# Patient Record
Sex: Male | Born: 1950 | Race: White | Hispanic: No | Marital: Married | Smoking: Never smoker
Health system: Southern US, Community
[De-identification: ages and names within clinical notes are randomized; demographics above are authoritative.]

## PROBLEM LIST (undated history)

## (undated) DIAGNOSIS — Z8489 Family history of other specified conditions: Secondary | ICD-10-CM

## (undated) DIAGNOSIS — C801 Malignant (primary) neoplasm, unspecified: Secondary | ICD-10-CM

## (undated) DIAGNOSIS — J45909 Unspecified asthma, uncomplicated: Secondary | ICD-10-CM

## (undated) HISTORY — PX: OTHER SURGICAL HISTORY: SHX169

## (undated) HISTORY — PX: TONSILECTOMY, ADENOIDECTOMY, BILATERAL MYRINGOTOMY AND TUBES: SHX2538

---

## 2004-10-16 ENCOUNTER — Ambulatory Visit: Payer: Self-pay | Admitting: Specialist

## 2011-01-16 ENCOUNTER — Emergency Department: Payer: Self-pay | Admitting: Emergency Medicine

## 2017-10-08 ENCOUNTER — Encounter: Payer: Self-pay | Admitting: Urology

## 2017-10-08 ENCOUNTER — Ambulatory Visit: Payer: BLUE CROSS/BLUE SHIELD | Admitting: Urology

## 2017-10-08 VITALS — BP 147/67 | HR 66 | Ht 72.0 in | Wt 200.8 lb

## 2017-10-08 DIAGNOSIS — F518 Other sleep disorders not due to a substance or known physiological condition: Secondary | ICD-10-CM

## 2017-10-08 NOTE — Progress Notes (Signed)
10/08/2017 1:59 PM   Roy Gentry Leslee Home 1951-03-09 [id number]  Referring provider: Lavera Guise, PA-C [address] [place], [place] [address]  Chief Complaint  Patient presents with  . Prostate Cancer    HPI: The patient is a 67 year old gentleman who presents today for evaluation after he was noted that his prostate was enlarged and hard on a recent colonoscopy.  His PSA in March 2019 was 1.85.  The patient reports that he has never heard that his prostate felt a regular wire or that his PSA has been elevated.  He has a cousin who had prostate cancer but no other relatives with the disease.  He has no urinary complaints at this time.  He denies hematuria, UTI, and nephrolithiasis.  He does have erectile dysfunction which is well controlled on sildenafil.   PMH: History reviewed. No pertinent past medical history.  Surgical History: Past Surgical History:  Procedure Laterality Date  . TONSILECTOMY, ADENOIDECTOMY, BILATERAL MYRINGOTOMY AND TUBES      Home Medications:  Allergies as of 10/08/2017      Reactions   Simvastatin    Other reaction(s): Muscle Pain Joint pain      Medication List        Accurate as of 10/08/17  1:59 PM. Always use your most recent med list.          Melatonin 5 MG Chew Chew by mouth.   sildenafil 100 MG tablet Commonly known as:  VIAGRA TAKE 1 TABLET (100 MG TOTAL) BY MOUTH ONCE DAILY AS NEEDED FOR ERECTILE DYSFUNCTION       Allergies:  Allergies  Allergen Reactions  . Simvastatin     Other reaction(s): Muscle Pain Joint pain    Family History: Family History  Problem Relation Age of Onset  . Prostate cancer Neg Hx   . Bladder Cancer Neg Hx   . Kidney cancer Neg Hx     Social History:  has no tobacco, alcohol, and drug history on file.  ROS: UROLOGY Frequent Urination?: No Hard to postpone urination?: No Burning/pain with urination?: No Get up at night to urinate?: No Leakage of urine?:  No Urine stream starts and stops?: No Trouble starting stream?: No Do you have to strain to urinate?: No Blood in urine?: No Urinary tract infection?: No Sexually transmitted disease?: No Injury to kidneys or bladder?: No Painful intercourse?: No Weak stream?: No Erection problems?: Yes Penile pain?: No  Gastrointestinal Nausea?: No Vomiting?: No Indigestion/heartburn?: No Diarrhea?: Yes Constipation?: No  Constitutional Fever: No Night sweats?: No Weight loss?: No Fatigue?: No  Skin Skin rash/lesions?: No Itching?: No  Eyes Blurred vision?: No Double vision?: No  Ears/Nose/Throat Sore throat?: No Sinus problems?: Yes  Hematologic/Lymphatic Swollen glands?: No Easy bruising?: No  Cardiovascular Leg swelling?: No Chest pain?: No  Respiratory Cough?: No Shortness of breath?: No  Endocrine Excessive thirst?: No  Musculoskeletal Back pain?: No Joint pain?: No  Neurological Headaches?: Yes Dizziness?: No  Psychologic Depression?: No Anxiety?: No  Physical Exam: BP (!) 147/67 (BP Location: Right Arm, Patient Position: Sitting, Cuff Size: Normal)   Pulse 66   Ht 6' (1.829 m)   Wt 200 lb 12.8 oz (91.1 kg)   BMI 27.23 kg/m   Constitutional:  Alert and oriented, No acute distress. HEENT: [place] AT, moist mucus membranes.  Trachea midline, no masses. Cardiovascular: No clubbing, cyanosis, or edema. Respiratory: Normal respiratory effort, no increased work of breathing. GI: Abdomen is soft, nontender, nondistended, no abdominal  masses GU: No CVA tenderness.  Normal phallus.  Testicles descended equally bilaterally.  Benign.  DRE 2+, left apex and mid of the prostate is indurated compared to the rest of the exam.  Normal right lobe.  No discrete nodules. Skin: No rashes, bruises or suspicious lesions. Lymph: No cervical or inguinal adenopathy. Neurologic: Grossly intact, no focal deficits, moving all 4 extremities. Psychiatric: Normal mood and  affect.  Laboratory Data: No results found for: WBC, HGB, HCT, MCV, PLT  No results found for: CREATININE  No results found for: PSA  No results found for: TESTOSTERONE  No results found for: HGBA1C  Urinalysis No results found for: COLORURINE, APPEARANCEUR, LABSPEC, PHURINE, GLUCOSEU, HGBUR, BILIRUBINUR, KETONESUR, PROTEINUR, UROBILINOGEN, NITRITE, LEUKOCYTESUR  Assessment & Plan:    1.  Induration of the left lobe of the prostate on digital rectal exam I discussed with the patient the indications for prostate biopsy which include having a normal digital rectal exam.  We discussed the next step would be a prostate biopsy.  We discussed this procedure in detail.  He understands the risks include bleeding and infection.  He knows to expect blood in his urine and stool for 48 hours and in his semen for up to 6 weeks.  He does understand the risk of sepsis requiring hospitalization is approximately 1 to 2%.  All questions were answered.  He is agreeable to proceeding.  Return for prostate biopsy.  Nickie Retort, MD  [facility] [address] [place], [place] [address] [phone]

## 2017-10-28 ENCOUNTER — Other Ambulatory Visit: Payer: Self-pay | Admitting: Urology

## 2017-10-28 ENCOUNTER — Ambulatory Visit (INDEPENDENT_AMBULATORY_CARE_PROVIDER_SITE_OTHER): Payer: Medicare Other | Admitting: Urology

## 2017-10-28 ENCOUNTER — Encounter: Payer: Self-pay | Admitting: Urology

## 2017-10-28 VITALS — BP 141/73 | HR 66 | Resp 16 | Ht 72.0 in | Wt 199.8 lb

## 2017-10-28 DIAGNOSIS — N400 Enlarged prostate without lower urinary tract symptoms: Secondary | ICD-10-CM | POA: Diagnosis not present

## 2017-10-28 MED ORDER — GENTAMICIN SULFATE 40 MG/ML IJ SOLN
80.0000 mg | Freq: Once | INTRAMUSCULAR | Status: AC
  Administered 2017-10-28: 80 mg via INTRAMUSCULAR

## 2017-10-28 MED ORDER — LEVOFLOXACIN 500 MG PO TABS
500.0000 mg | ORAL_TABLET | Freq: Once | ORAL | Status: AC
Start: 2017-10-28 — End: 2017-10-28
  Administered 2017-10-28: 500 mg via ORAL

## 2017-10-28 NOTE — Progress Notes (Signed)
Prostate Biopsy Procedure  67 year old male with a PSA of 1.85 but abnormal DRE.  On my exam there is induration of the right apex and not the left as described in Dr. Carlynn Purl note.  Informed consent was obtained after discussing risks/benefits of the procedure.  A time out was performed to ensure correct patient identity.  Pre-Procedure: - Last PSA Level: 07/2017 1.85 - Gentamicin given prophylactically - Levaquin 500 mg administered PO -Transrectal Ultrasound performed revealing a 43.5 gm prostate - Hypoechoic area right apical region  Procedure: - Prostate block performed using 10 cc 1% lidocaine and biopsies taken from sextant areas, a total of 12 under ultrasound guidance.  A digital biopsy of the right apex was included in the right apical cores  Post-Procedure: - Patient tolerated the procedure well - He was counseled to seek immediate medical attention if experiences any severe pain, significant bleeding, or fevers - Return in one week to discuss biopsy results   Roy Mellott C.  Roy Heater, MD

## 2017-10-29 ENCOUNTER — Other Ambulatory Visit: Payer: BLUE CROSS/BLUE SHIELD

## 2017-11-04 ENCOUNTER — Other Ambulatory Visit: Payer: Self-pay | Admitting: Urology

## 2017-11-04 LAB — PATHOLOGY REPORT

## 2017-11-11 ENCOUNTER — Encounter: Payer: Self-pay | Admitting: Urology

## 2017-11-11 ENCOUNTER — Ambulatory Visit (INDEPENDENT_AMBULATORY_CARE_PROVIDER_SITE_OTHER): Payer: Medicare Other | Admitting: Urology

## 2017-11-11 VITALS — BP 127/76 | HR 58 | Resp 16 | Ht 72.0 in | Wt 202.9 lb

## 2017-11-11 DIAGNOSIS — C61 Malignant neoplasm of prostate: Secondary | ICD-10-CM

## 2017-11-11 NOTE — Progress Notes (Signed)
11/11/2017 2:41 PM   Roy Gentry [birth year] [id number]  Referring provider: Baxter Hire, Gentry Roy Gentry, Roy Gentry [id number]  Chief Complaint  Patient presents with  . Follow-up    HPI: 67 year old male presents for prostate biopsy follow-up.  Prostate biopsy was performed on 10/28/2017 for a hard nodule involving the right apex and mid prostate.  Prostate volume was calculated at 43.5 g.  The right peripheral zone was hypoechoic in the mid to apical prostate.  Standard 12 core biopsies were performed.  Digital biopsy was also obtained and placed in the right apical jars.    Pathology: All left-sided cores were negative for malignancy. The right apical core was positive for Gleason 4+4 adenocarcinoma involving 84% of the submitted tissue in the right lateral apical core showed Gleason 4+5 adenocarcinoma involving 93% of the submitted tissue.  The right lateral mid core showed Gleason 4+4 adenocarcinoma involving 96% of the submitted tissue.  Low volume Gleason 3+4 adenocarcinoma was found in the base, lateral base and right mid cores.  PMH: History reviewed. No pertinent past medical history.  Surgical History: Past Surgical History:  Procedure Laterality Date  . TONSILECTOMY, ADENOIDECTOMY, BILATERAL MYRINGOTOMY AND TUBES      Home Medications:  Allergies as of 11/11/2017      Reactions   Simvastatin    Other reaction(s): Muscle Pain Joint pain      Medication List        Accurate as of 11/11/17  2:41 PM. Always use your most recent med list.          Melatonin 5 MG Chew Chew by mouth.   sildenafil 100 MG tablet Commonly known as:  VIAGRA TAKE 1 TABLET (100 MG TOTAL) BY MOUTH ONCE DAILY AS NEEDED FOR ERECTILE DYSFUNCTION       Allergies:  Allergies  Allergen Reactions  . Simvastatin     Other reaction(s): Muscle Pain Joint pain    Family History: Family History  Problem Relation Age of Onset  . Prostate cancer Neg Hx   . Bladder  Cancer Neg Hx   . Kidney cancer Neg Hx     Social History:  reports that he has never smoked. He has never used smokeless tobacco. His alcohol and drug histories are not on file.  ROS: UROLOGY Frequent Urination?: No Hard to postpone urination?: No Burning/pain with urination?: No Get up at night to urinate?: No Leakage of urine?: No Urine stream starts and stops?: No Trouble starting stream?: No Do you have to strain to urinate?: No Blood in urine?: No Urinary tract infection?: No Sexually transmitted disease?: No Injury to kidneys or bladder?: No Painful intercourse?: No Weak stream?: No Erection problems?: No Penile pain?: No  Gastrointestinal Nausea?: No Vomiting?: No Indigestion/heartburn?: No Diarrhea?: No Constipation?: No  Constitutional Fever: No Night sweats?: No Weight loss?: No Fatigue?: No  Skin Skin rash/lesions?: No Itching?: No  Eyes Blurred vision?: No Double vision?: No  Ears/Nose/Throat Sore throat?: No Sinus problems?: No  Hematologic/Lymphatic Swollen glands?: No Easy bruising?: No  Cardiovascular Leg swelling?: No Chest pain?: No  Respiratory Cough?: No Shortness of breath?: No  Endocrine Excessive thirst?: No  Musculoskeletal Back pain?: No Joint pain?: No  Neurological Headaches?: No Dizziness?: No  Psychologic Depression?: No Anxiety?: No  Physical Exam: BP 127/76   Pulse (!) 58   Resp 16   Ht 6' (1.829 m)   Wt 202 lb 14.4 oz (92 kg)   SpO2 96%  BMI 27.52 kg/m   Constitutional:  Alert and oriented, No acute distress.   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  The pathology report was discussed in detail with Roy Gentry and his wife.  Will schedule staging studies including a bone scan and CT of the abdomen pelvis.  They were informed treatment recommendations would be based on theresults of these studies.  They were provided literature on  diagnosis and treatment of prostate cancer.  He will follow-up after bone scan/CT.  Greater than 50% of this 15-minute visit was spent counseling the patient.   Roy Gentry, Roy Gentry [address], [place], [place] [address] [phone]

## 2017-11-12 ENCOUNTER — Ambulatory Visit: Payer: BLUE CROSS/BLUE SHIELD

## 2017-11-24 ENCOUNTER — Encounter
Admission: RE | Admit: 2017-11-24 | Discharge: 2017-11-24 | Disposition: A | Discharge status: Home / Self Care | Payer: Medicare Other | Source: Ambulatory Visit | Attending: Urology | Admitting: Urology

## 2017-11-24 ENCOUNTER — Ambulatory Visit
Admission: RE | Admit: 2017-11-24 | Discharge: 2017-11-24 | Disposition: A | Discharge status: Home / Self Care | Payer: Medicare Other | Source: Ambulatory Visit | Attending: Urology | Admitting: Urology

## 2017-11-24 ENCOUNTER — Other Ambulatory Visit: Payer: Self-pay

## 2017-11-24 DIAGNOSIS — D1723 Benign lipomatous neoplasm of skin and subcutaneous tissue of right leg: Secondary | ICD-10-CM | POA: Diagnosis not present

## 2017-11-24 DIAGNOSIS — I700 Atherosclerosis of aorta: Secondary | ICD-10-CM | POA: Diagnosis not present

## 2017-11-24 DIAGNOSIS — M19072 Primary osteoarthritis, left ankle and foot: Secondary | ICD-10-CM | POA: Insufficient documentation

## 2017-11-24 DIAGNOSIS — M19012 Primary osteoarthritis, left shoulder: Secondary | ICD-10-CM | POA: Insufficient documentation

## 2017-11-24 DIAGNOSIS — R937 Abnormal findings on diagnostic imaging of other parts of musculoskeletal system: Secondary | ICD-10-CM | POA: Insufficient documentation

## 2017-11-24 DIAGNOSIS — K5730 Diverticulosis of large intestine without perforation or abscess without bleeding: Secondary | ICD-10-CM | POA: Diagnosis not present

## 2017-11-24 DIAGNOSIS — C61 Malignant neoplasm of prostate: Secondary | ICD-10-CM | POA: Insufficient documentation

## 2017-11-24 DIAGNOSIS — I252 Old myocardial infarction: Secondary | ICD-10-CM | POA: Diagnosis not present

## 2017-11-24 DIAGNOSIS — M19011 Primary osteoarthritis, right shoulder: Secondary | ICD-10-CM | POA: Insufficient documentation

## 2017-11-24 DIAGNOSIS — M170 Bilateral primary osteoarthritis of knee: Secondary | ICD-10-CM | POA: Diagnosis not present

## 2017-11-24 DIAGNOSIS — M19071 Primary osteoarthritis, right ankle and foot: Secondary | ICD-10-CM | POA: Insufficient documentation

## 2017-11-24 DIAGNOSIS — N400 Enlarged prostate without lower urinary tract symptoms: Secondary | ICD-10-CM | POA: Insufficient documentation

## 2017-11-24 HISTORY — DX: Unspecified asthma, uncomplicated: J45.909

## 2017-11-24 HISTORY — DX: Malignant (primary) neoplasm, unspecified: C80.1

## 2017-11-24 LAB — POCT I-STAT CREATININE: Creatinine, Ser: 0.8 mg/dL (ref 0.61–1.24)

## 2017-11-24 MED ORDER — IOPAMIDOL (ISOVUE-300) INJECTION 61%
100.0000 mL | Freq: Once | INTRAVENOUS | Status: AC | PRN
  Administered 2017-11-24: 100 mL via INTRAVENOUS

## 2017-11-24 MED ORDER — TECHNETIUM TC 99M MEDRONATE IV KIT
22.2500 | PACK | Freq: Once | INTRAVENOUS | Status: AC | PRN
  Administered 2017-11-24: 22.25 via INTRAVENOUS

## 2017-11-25 ENCOUNTER — Telehealth: Payer: Self-pay

## 2017-11-25 NOTE — Telephone Encounter (Signed)
-----   Message from Abbie Sons, MD sent at 11/25/2017  9:40 AM EDT ----- CT and bone scan showed no definite evidence of metastatic disease.  Keep follow-up as scheduled.

## 2017-11-25 NOTE — Telephone Encounter (Signed)
Pt informed

## 2017-12-01 ENCOUNTER — Ambulatory Visit: Payer: Medicare Other | Admitting: Urology

## 2017-12-01 ENCOUNTER — Encounter: Payer: Self-pay | Admitting: Urology

## 2017-12-01 VITALS — BP 135/80 | HR 61 | Ht 72.0 in | Wt 202.4 lb

## 2017-12-01 DIAGNOSIS — C61 Malignant neoplasm of prostate: Secondary | ICD-10-CM

## 2017-12-03 ENCOUNTER — Encounter: Payer: Self-pay | Admitting: Urology

## 2017-12-03 NOTE — Progress Notes (Signed)
12/01/2017 11:01 AM   Roy Gentry May 07, 1951 [id number]  Referring provider: Baxter Hire, MD [place], [place] [address]  Chief Complaint  Patient presents with  . 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  See prior office note of 11/11/2017.  He presents today for follow-up of his CT and bone scan.  He has no complaints today.  Bone scan showed no definite evidence of metastatic disease.  There was a tiny focus of activity in the medial left 11th rib. CT of the abdomen pelvis showed no lymphadenopathy or radiographic evidence of extraprostatic extension   PMH: Past Medical History:  Diagnosis Date  . Asthma   . Cancer [facility])     Surgical History: Past Surgical History:  Procedure Laterality Date  . TONSILECTOMY, ADENOIDECTOMY, BILATERAL MYRINGOTOMY AND TUBES      Home Medications:  Allergies as of 12/01/2017      Reactions   Simvastatin    Other reaction(s): Muscle Pain Joint pain      Medication List        Accurate as of 12/01/17 11:59 PM. Always use your most recent med list.          Melatonin 5 MG Chew Chew by mouth.   sildenafil 100 MG tablet Commonly known as:  VIAGRA TAKE 1 TABLET (100 MG TOTAL) BY MOUTH ONCE DAILY AS NEEDED FOR ERECTILE DYSFUNCTION       Allergies:  Allergies  Allergen Reactions  . Simvastatin     Other reaction(s): Muscle Pain Joint pain    Family History: Family History  Problem Relation Age of Onset  . Prostate cancer Neg Hx   . Bladder Cancer Neg Hx   . Kidney cancer Neg Hx     Social History:  reports that he has never smoked. He has never used smokeless tobacco. He reports that he drank alcohol. He reports that he does not use drugs.  ROS: UROLOGY Frequent Urination?: No Hard to postpone urination?: No Burning/pain with urination?: No Get up at night to urinate?: No Leakage of urine?: No Urine stream starts and stops?:  No Trouble starting stream?: No Do you have to strain to urinate?: No Blood in urine?: No Urinary tract infection?: No Sexually transmitted disease?: No Injury to kidneys or bladder?: No Painful intercourse?: No Weak stream?: No Erection problems?: No Penile pain?: No  Gastrointestinal Nausea?: No Vomiting?: No Indigestion/heartburn?: No Diarrhea?: No Constipation?: No  Constitutional Fever: No Night sweats?: No Weight loss?: No Fatigue?: No  Skin Skin rash/lesions?: No Itching?: No  Eyes Blurred vision?: No Double vision?: No  Ears/Nose/Throat Sore throat?: No Sinus problems?: No  Hematologic/Lymphatic Swollen glands?: No Easy bruising?: No  Cardiovascular Leg swelling?: No Chest pain?: No  Respiratory Cough?: No Shortness of breath?: No  Endocrine Excessive thirst?: No  Musculoskeletal Back pain?: Yes Joint pain?: No  Neurological Headaches?: No Dizziness?: No  Psychologic Depression?: No Anxiety?: No  Physical Exam: BP 135/80 (BP Location: Left Arm, Patient Position: Sitting, Cuff Size: Normal)   Pulse 61   Ht 6' (1.829 m)   Wt 202 lb 6.4 oz (91.8 kg)   BMI 27.45 kg/m   Constitutional:  Alert and oriented, No acute distress. HEENT: [place] AT, moist mucus membranes.  Trachea midline, no masses. Cardiovascular: No clubbing, cyanosis, or edema. Respiratory: Normal respiratory effort, no increased work of breathing. GI: Abdomen is soft, nontender, nondistended, no abdominal masses GU: No CVA  tenderness Lymph: No cervical or inguinal lymphadenopathy. Skin: No rashes, bruises or suspicious lesions. Neurologic: Grossly intact, no focal deficits, moving all 4 extremities. Psychiatric: Normal mood and affect.   Assessment & Plan:   Clinical T2b N0 M0 adenocarcinoma the prostate.  He falls into the NCCN very high risk group.  Curative management options were discussed in detail including IMRT plus ADT; IMRT/brachytherapy plus ADT and radical  prostatectomy with pelvic lymph node dissection.  He was informed that he has a higher chance of extracapsular extension based on his pathology.  The side effects of radical prostatectomy and radiation therapy were reviewed.  He is primarily interested in radiation modalities and will refer to radiation oncology.  Greater than 50% of this 15-minute visit was spent counseling the patient.    Abbie Sons, [place] [address], [place], [place] [address] [phone]

## 2017-12-14 ENCOUNTER — Institutional Professional Consult (permissible substitution): Payer: Medicare Other | Admitting: Radiation Oncology

## 2017-12-21 ENCOUNTER — Telehealth: Payer: Self-pay | Admitting: Urology

## 2017-12-21 ENCOUNTER — Encounter: Payer: Self-pay | Admitting: Radiation Oncology

## 2017-12-21 ENCOUNTER — Encounter (INDEPENDENT_AMBULATORY_CARE_PROVIDER_SITE_OTHER): Payer: Self-pay

## 2017-12-21 ENCOUNTER — Ambulatory Visit
Admission: RE | Admit: 2017-12-21 | Discharge: 2017-12-21 | Disposition: A | Discharge status: Home / Self Care | Payer: Medicare Other | Source: Ambulatory Visit | Attending: Radiation Oncology | Admitting: Radiation Oncology

## 2017-12-21 ENCOUNTER — Other Ambulatory Visit: Payer: Self-pay

## 2017-12-21 VITALS — BP 130/63 | HR 64 | Temp 97.2°F | Resp 20 | Wt 203.9 lb

## 2017-12-21 DIAGNOSIS — C61 Malignant neoplasm of prostate: Secondary | ICD-10-CM | POA: Insufficient documentation

## 2017-12-21 DIAGNOSIS — J45909 Unspecified asthma, uncomplicated: Secondary | ICD-10-CM | POA: Insufficient documentation

## 2017-12-21 NOTE — Consult Note (Signed)
NEW PATIENT EVALUATION  Name: Roy Gentry  MRN: [id number]  Date:   12/21/2017     DOB: 1951/05/09   This 67 y.o. 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  REFERRING PHYSICIAN: Baxter Hire, MD  CHIEF COMPLAINT:  Chief Complaint  Patient presents with  . Prostate Cancer    Pt is here for initial consultation of prostate    DIAGNOSIS: The encounter diagnosis was Malignant neoplasm of prostate ([place]).   PREVIOUS INVESTIGATIONS:  Bone scan and CT scan reviewed Pathology reports reviewed Clinical notes reviewed  HPI: patient is a 67 year old male who interestingly enough presented with an nodular prostate at time oflower endoscopy by gastroenterology. He was referred to urology and underwent transrectal ultrasound-guided biopsy even though his prostate PSA was approximate 1.8. He did have a nodular prostate with asymmetry in the right lateral lobe. Biopsy was positive for Gleason 8 (4+4 adenocarcinoma involving 84% of [name] tissue in the right lateral apical core. There is also evidence of Gleason 9 (4+5) involving 93% tissue of the right lateral apex. Bone scan showed a single focus in one rib not thought to be clinically significant. CT scan showed no evidence of pelvic adenopathy or evidence of extracapsular spread. Patient is fairly asymptomatic specifically denies urinary frequency urgency or nocturia. He's been seen by urology and is now referred to radiation oncology for opinion.  PLANNED TREATMENT REGIMEN: ADT therapy plus external beam radiation to his prostate and pelvic nodes plus I-125 interstitial implant for boost  PAST MEDICAL HISTORY:  has a past medical history of Asthma and Cancer ([place]).    PAST SURGICAL HISTORY:  Past Surgical History:  Procedure Laterality Date  . TONSILECTOMY, ADENOIDECTOMY, BILATERAL MYRINGOTOMY AND  TUBES      FAMILY HISTORY: family history is not on file.  SOCIAL HISTORY:  reports that he has never smoked. He has never used smokeless tobacco. He reports that he drank alcohol. He reports that he does not use drugs.  ALLERGIES: Simvastatin  MEDICATIONS:  Current Outpatient Medications  Medication Sig Dispense Refill  . Melatonin 5 MG CHEW Chew by mouth.    . sildenafil (VIAGRA) 100 MG tablet TAKE 1 TABLET (100 MG TOTAL) BY MOUTH ONCE DAILY AS NEEDED FOR ERECTILE DYSFUNCTION  3   No current facility-administered medications for this encounter.     ECOG PERFORMANCE STATUS:  0 - Asymptomatic  REVIEW OF SYSTEMS: Well-developed well-nourished patient in NAD. HEENT reveals PERLA, EOMI, discs not visualized.  Oral cavity is clear. No oral mucosal lesions are identified. Neck is clear without evidence of cervical or supraclavicular adenopathy. Lungs are clear to A&P. Cardiac examination is essentially unremarkable with regular rate and rhythm without murmur rub or thrill. Abdomen is benign with no organomegaly or masses noted. Motor sensory and DTR levels are equal and symmetric in the upper and lower extremities. Cranial nerves II through XII are grossly intact. Proprioception is intact. No peripheral adenopathy or edema is identified. No motor or sensory levels are noted. Crude visual fields are within normal range.   PHYSICAL EXAM: BP 130/63   Pulse 64   Temp (!) 97.2 F (36.2 C)   Resp 20   Wt 203 lb 14.8 oz (92.5 kg)   BMI 27.66 kg/m  On rectal exam there is asymmetry in the right lateral lobe with nodular prostate sulcus is preserved bilaterally some  Abascal regions appear unremarkable. No other rectal abnormality is noted.Well-developed well-nourished patient in NAD. HEENT reveals PERLA, EOMI, discs not visualized.  Oral cavity is clear. No oral mucosal lesions are identified. Neck is clear without evidence of cervical or supraclavicular adenopathy. Lungs are clear to A&P. Cardiac  examination is essentially unremarkable with regular rate and rhythm without murmur rub or thrill. Abdomen is benign with no organomegaly or masses noted. Motor sensory and DTR levels are equal and symmetric in the upper and lower extremities. Cranial nerves II through XII are grossly intact. Proprioception is intact. No peripheral adenopathy or edema is identified. No motor or sensory levels are noted. Crude visual fields are within normal range.  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 Also has a 29% chance of lymph node involvement. I have recommended androgen deprivation therapy for at least a year and a half to 2 years. I've also recommended external beam radiation therapy to both his prostate and pelvic knows then boosting his prostate with I-125 interstitial implant for boost. Risks and benefits of treatment including increased lower urinary tract symptoms diarrhea fatigue alteration of blood counts all were discussed in detail with the patient. He seems to comprehend my treatment plan well. I've contacted Dr. Bernardo Heater is to commence Angie deprivation therapy. I've personally ordered and scheduled CT simulation for later this week. Patient and his wife both seem to comprehend my treatment plan well.  I would like to take this opportunity to thank you for allowing me to participate in the care of your patient.Noreene Filbert, MD

## 2017-12-21 NOTE — Telephone Encounter (Signed)
I received a call today from [facility] @ Dr. Olena Leatherwood office and he stated that he wants this patient to follow back up here for his lupron injections? Can you advise on what you want him to have done?   Thanks, Sharyn Lull

## 2017-12-21 NOTE — Telephone Encounter (Signed)
He needs to come in for a six-month Lupron injection X 3

## 2017-12-23 ENCOUNTER — Ambulatory Visit
Admission: RE | Admit: 2017-12-23 | Discharge: 2017-12-23 | Disposition: A | Discharge status: Home / Self Care | Payer: Medicare Other | Source: Ambulatory Visit | Attending: Radiation Oncology | Admitting: Radiation Oncology

## 2017-12-23 DIAGNOSIS — C61 Malignant neoplasm of prostate: Secondary | ICD-10-CM | POA: Insufficient documentation

## 2017-12-24 ENCOUNTER — Encounter: Payer: Self-pay | Admitting: Family Medicine

## 2017-12-24 ENCOUNTER — Ambulatory Visit (INDEPENDENT_AMBULATORY_CARE_PROVIDER_SITE_OTHER): Payer: Medicare Other | Admitting: Family Medicine

## 2017-12-24 VITALS — BP 130/80 | HR 76 | Ht 72.0 in | Wt 202.0 lb

## 2017-12-24 DIAGNOSIS — C61 Malignant neoplasm of prostate: Secondary | ICD-10-CM | POA: Diagnosis present

## 2017-12-24 DIAGNOSIS — Z510 Encounter for antineoplastic radiation therapy: Secondary | ICD-10-CM | POA: Insufficient documentation

## 2017-12-24 MED ORDER — LEUPROLIDE ACETATE (6 MONTH) 45 MG IM KIT
45.0000 mg | PACK | Freq: Once | INTRAMUSCULAR | Status: AC
  Administered 2017-12-24: 45 mg via INTRAMUSCULAR

## 2017-12-24 NOTE — Progress Notes (Signed)
Lupron IM Injection   Due to Prostate Cancer patient is present today for a Lupron Injection.  Medication: Lupron 6 month Dose: 45 mg  Location: left upper outer buttocks Lot: [id number] Exp: 02/17/2020  Patient tolerated well, no complications were noted  Performed by: Elberta Leatherwood, CMA  Follow up: 6 months

## 2017-12-31 ENCOUNTER — Other Ambulatory Visit: Payer: Self-pay | Admitting: Radiology

## 2017-12-31 DIAGNOSIS — C61 Malignant neoplasm of prostate: Secondary | ICD-10-CM

## 2018-01-01 ENCOUNTER — Other Ambulatory Visit: Payer: Self-pay | Admitting: *Deleted

## 2018-01-01 DIAGNOSIS — C61 Malignant neoplasm of prostate: Secondary | ICD-10-CM

## 2018-01-06 ENCOUNTER — Ambulatory Visit
Admission: RE | Admit: 2018-01-06 | Discharge: 2018-01-06 | Disposition: A | Discharge status: Home / Self Care | Payer: Medicare Other | Source: Ambulatory Visit | Attending: Radiation Oncology | Admitting: Radiation Oncology

## 2018-01-07 ENCOUNTER — Ambulatory Visit
Admission: RE | Admit: 2018-01-07 | Discharge: 2018-01-07 | Disposition: A | Discharge status: Home / Self Care | Payer: Medicare Other | Source: Ambulatory Visit | Attending: Radiation Oncology | Admitting: Radiation Oncology

## 2018-01-07 DIAGNOSIS — C61 Malignant neoplasm of prostate: Secondary | ICD-10-CM | POA: Diagnosis not present

## 2018-01-08 ENCOUNTER — Ambulatory Visit
Admission: RE | Admit: 2018-01-08 | Discharge: 2018-01-08 | Disposition: A | Discharge status: Home / Self Care | Payer: Medicare Other | Source: Ambulatory Visit | Attending: Radiation Oncology | Admitting: Radiation Oncology

## 2018-01-08 DIAGNOSIS — C61 Malignant neoplasm of prostate: Secondary | ICD-10-CM | POA: Diagnosis not present

## 2018-01-10 ENCOUNTER — Ambulatory Visit: Admission: RE | Admit: 2018-01-10 | Payer: Medicare Other | Source: Ambulatory Visit

## 2018-01-11 ENCOUNTER — Ambulatory Visit: Payer: Medicare Other

## 2018-01-11 ENCOUNTER — Ambulatory Visit
Admission: RE | Admit: 2018-01-11 | Discharge: 2018-01-11 | Disposition: A | Discharge status: Home / Self Care | Payer: Medicare Other | Source: Ambulatory Visit | Attending: Radiation Oncology | Admitting: Radiation Oncology

## 2018-01-11 DIAGNOSIS — C61 Malignant neoplasm of prostate: Secondary | ICD-10-CM | POA: Diagnosis not present

## 2018-01-12 ENCOUNTER — Ambulatory Visit
Admission: RE | Admit: 2018-01-12 | Discharge: 2018-01-12 | Disposition: A | Discharge status: Home / Self Care | Payer: Medicare Other | Source: Ambulatory Visit | Attending: Radiation Oncology | Admitting: Radiation Oncology

## 2018-01-12 DIAGNOSIS — C61 Malignant neoplasm of prostate: Secondary | ICD-10-CM | POA: Diagnosis not present

## 2018-01-13 ENCOUNTER — Ambulatory Visit
Admission: RE | Admit: 2018-01-13 | Discharge: 2018-01-13 | Disposition: A | Discharge status: Home / Self Care | Payer: Medicare Other | Source: Ambulatory Visit | Attending: Radiation Oncology | Admitting: Radiation Oncology

## 2018-01-13 DIAGNOSIS — C61 Malignant neoplasm of prostate: Secondary | ICD-10-CM | POA: Diagnosis not present

## 2018-01-14 ENCOUNTER — Ambulatory Visit
Admission: RE | Admit: 2018-01-14 | Discharge: 2018-01-14 | Disposition: A | Discharge status: Home / Self Care | Payer: Medicare Other | Source: Ambulatory Visit | Attending: Radiation Oncology | Admitting: Radiation Oncology

## 2018-01-14 DIAGNOSIS — C61 Malignant neoplasm of prostate: Secondary | ICD-10-CM | POA: Diagnosis not present

## 2018-01-15 ENCOUNTER — Ambulatory Visit
Admission: RE | Admit: 2018-01-15 | Discharge: 2018-01-15 | Disposition: A | Discharge status: Home / Self Care | Payer: Medicare Other | Source: Ambulatory Visit | Attending: Radiation Oncology | Admitting: Radiation Oncology

## 2018-01-15 DIAGNOSIS — C61 Malignant neoplasm of prostate: Secondary | ICD-10-CM | POA: Diagnosis not present

## 2018-01-18 ENCOUNTER — Ambulatory Visit
Admission: RE | Admit: 2018-01-18 | Discharge: 2018-01-18 | Disposition: A | Discharge status: Home / Self Care | Payer: Medicare Other | Source: Ambulatory Visit | Attending: Radiation Oncology | Admitting: Radiation Oncology

## 2018-01-18 DIAGNOSIS — C61 Malignant neoplasm of prostate: Secondary | ICD-10-CM | POA: Diagnosis not present

## 2018-01-19 ENCOUNTER — Ambulatory Visit
Admission: RE | Admit: 2018-01-19 | Discharge: 2018-01-19 | Disposition: A | Discharge status: Home / Self Care | Payer: Medicare Other | Source: Ambulatory Visit | Attending: Radiation Oncology | Admitting: Radiation Oncology

## 2018-01-19 ENCOUNTER — Other Ambulatory Visit: Payer: Self-pay | Admitting: *Deleted

## 2018-01-19 DIAGNOSIS — C61 Malignant neoplasm of prostate: Secondary | ICD-10-CM | POA: Diagnosis not present

## 2018-01-19 MED ORDER — TAMSULOSIN HCL 0.4 MG PO CAPS: 0.4000 mg | ORAL_CAPSULE | Freq: Every day | ORAL | 6 refills | Status: DC

## 2018-01-20 ENCOUNTER — Ambulatory Visit
Admission: RE | Admit: 2018-01-20 | Discharge: 2018-01-20 | Disposition: A | Discharge status: Home / Self Care | Payer: Medicare Other | Source: Ambulatory Visit | Attending: Radiation Oncology | Admitting: Radiation Oncology

## 2018-01-20 DIAGNOSIS — C61 Malignant neoplasm of prostate: Secondary | ICD-10-CM | POA: Diagnosis not present

## 2018-01-21 ENCOUNTER — Ambulatory Visit
Admission: RE | Admit: 2018-01-21 | Discharge: 2018-01-21 | Disposition: A | Discharge status: Home / Self Care | Payer: Medicare Other | Source: Ambulatory Visit | Attending: Radiation Oncology | Admitting: Radiation Oncology

## 2018-01-21 ENCOUNTER — Inpatient Hospital Stay: Payer: Medicare Other | Attending: Radiation Oncology

## 2018-01-21 DIAGNOSIS — C61 Malignant neoplasm of prostate: Secondary | ICD-10-CM | POA: Diagnosis present

## 2018-01-21 LAB — CBC
HEMATOCRIT: 41.8 % (ref 40.0–52.0)
HEMOGLOBIN: 14.2 g/dL (ref 13.0–18.0)
MCH: 31.2 pg (ref 26.0–34.0)
MCHC: 34 g/dL (ref 32.0–36.0)
MCV: 91.9 fL (ref 80.0–100.0)
Platelets: 240 10*3/uL (ref 150–440)
RBC: 4.55 MIL/uL (ref 4.40–5.90)
RDW: 13.5 % (ref 11.5–14.5)
WBC: 4.5 10*3/uL (ref 3.8–10.6)

## 2018-01-22 ENCOUNTER — Ambulatory Visit
Admission: RE | Admit: 2018-01-22 | Discharge: 2018-01-22 | Disposition: A | Discharge status: Home / Self Care | Payer: Medicare Other | Source: Ambulatory Visit | Attending: Radiation Oncology | Admitting: Radiation Oncology

## 2018-01-22 DIAGNOSIS — C61 Malignant neoplasm of prostate: Secondary | ICD-10-CM | POA: Diagnosis not present

## 2018-01-26 ENCOUNTER — Ambulatory Visit
Admission: RE | Admit: 2018-01-26 | Discharge: 2018-01-26 | Disposition: A | Discharge status: Home / Self Care | Payer: Medicare Other | Source: Ambulatory Visit | Attending: Radiation Oncology | Admitting: Radiation Oncology

## 2018-01-26 DIAGNOSIS — Z510 Encounter for antineoplastic radiation therapy: Secondary | ICD-10-CM | POA: Insufficient documentation

## 2018-01-26 DIAGNOSIS — C61 Malignant neoplasm of prostate: Secondary | ICD-10-CM | POA: Diagnosis not present

## 2018-01-27 ENCOUNTER — Ambulatory Visit
Admission: RE | Admit: 2018-01-27 | Discharge: 2018-01-27 | Disposition: A | Discharge status: Home / Self Care | Payer: Medicare Other | Source: Ambulatory Visit | Attending: Radiation Oncology | Admitting: Radiation Oncology

## 2018-01-27 DIAGNOSIS — C61 Malignant neoplasm of prostate: Secondary | ICD-10-CM | POA: Diagnosis not present

## 2018-01-28 ENCOUNTER — Ambulatory Visit
Admission: RE | Admit: 2018-01-28 | Discharge: 2018-01-28 | Disposition: A | Discharge status: Home / Self Care | Payer: Medicare Other | Source: Ambulatory Visit | Attending: Radiation Oncology | Admitting: Radiation Oncology

## 2018-01-28 DIAGNOSIS — C61 Malignant neoplasm of prostate: Secondary | ICD-10-CM | POA: Diagnosis not present

## 2018-01-29 ENCOUNTER — Ambulatory Visit
Admission: RE | Admit: 2018-01-29 | Discharge: 2018-01-29 | Disposition: A | Discharge status: Home / Self Care | Payer: Medicare Other | Source: Ambulatory Visit | Attending: Radiation Oncology | Admitting: Radiation Oncology

## 2018-01-29 DIAGNOSIS — C61 Malignant neoplasm of prostate: Secondary | ICD-10-CM | POA: Diagnosis not present

## 2018-02-01 ENCOUNTER — Ambulatory Visit
Admission: RE | Admit: 2018-02-01 | Discharge: 2018-02-01 | Disposition: A | Discharge status: Home / Self Care | Payer: Medicare Other | Source: Ambulatory Visit | Attending: Radiation Oncology | Admitting: Radiation Oncology

## 2018-02-01 DIAGNOSIS — C61 Malignant neoplasm of prostate: Secondary | ICD-10-CM | POA: Diagnosis not present

## 2018-02-02 ENCOUNTER — Ambulatory Visit
Admission: RE | Admit: 2018-02-02 | Discharge: 2018-02-02 | Disposition: A | Discharge status: Home / Self Care | Payer: Medicare Other | Source: Ambulatory Visit | Attending: Radiation Oncology | Admitting: Radiation Oncology

## 2018-02-02 DIAGNOSIS — C61 Malignant neoplasm of prostate: Secondary | ICD-10-CM | POA: Diagnosis not present

## 2018-02-03 ENCOUNTER — Ambulatory Visit
Admission: RE | Admit: 2018-02-03 | Discharge: 2018-02-03 | Disposition: A | Discharge status: Home / Self Care | Payer: Medicare Other | Source: Ambulatory Visit | Attending: Radiation Oncology | Admitting: Radiation Oncology

## 2018-02-03 DIAGNOSIS — C61 Malignant neoplasm of prostate: Secondary | ICD-10-CM | POA: Diagnosis not present

## 2018-02-04 ENCOUNTER — Other Ambulatory Visit: Payer: Self-pay

## 2018-02-04 ENCOUNTER — Inpatient Hospital Stay: Payer: Medicare Other | Attending: Radiation Oncology

## 2018-02-04 ENCOUNTER — Ambulatory Visit
Admission: RE | Admit: 2018-02-04 | Discharge: 2018-02-04 | Disposition: A | Discharge status: Home / Self Care | Payer: Medicare Other | Source: Ambulatory Visit | Attending: Radiation Oncology | Admitting: Radiation Oncology

## 2018-02-04 DIAGNOSIS — C61 Malignant neoplasm of prostate: Secondary | ICD-10-CM | POA: Insufficient documentation

## 2018-02-04 LAB — CBC
HCT: 40.2 % (ref 40.0–52.0)
HEMOGLOBIN: 13.7 g/dL (ref 13.0–18.0)
MCH: 31 pg (ref 26.0–34.0)
MCHC: 34 g/dL (ref 32.0–36.0)
MCV: 91.2 fL (ref 80.0–100.0)
Platelets: 206 10*3/uL (ref 150–440)
RBC: 4.41 MIL/uL (ref 4.40–5.90)
RDW: 13.3 % (ref 11.5–14.5)
WBC: 3.9 10*3/uL (ref 3.8–10.6)

## 2018-02-05 ENCOUNTER — Ambulatory Visit
Admission: RE | Admit: 2018-02-05 | Discharge: 2018-02-05 | Disposition: A | Discharge status: Home / Self Care | Payer: Medicare Other | Source: Ambulatory Visit | Attending: Radiation Oncology | Admitting: Radiation Oncology

## 2018-02-05 DIAGNOSIS — C61 Malignant neoplasm of prostate: Secondary | ICD-10-CM | POA: Diagnosis not present

## 2018-02-08 ENCOUNTER — Ambulatory Visit
Admission: RE | Admit: 2018-02-08 | Discharge: 2018-02-08 | Disposition: A | Discharge status: Home / Self Care | Payer: Medicare Other | Source: Ambulatory Visit | Attending: Radiation Oncology | Admitting: Radiation Oncology

## 2018-02-08 DIAGNOSIS — C61 Malignant neoplasm of prostate: Secondary | ICD-10-CM | POA: Diagnosis not present

## 2018-02-09 ENCOUNTER — Ambulatory Visit
Admission: RE | Admit: 2018-02-09 | Discharge: 2018-02-09 | Disposition: A | Discharge status: Home / Self Care | Payer: Medicare Other | Source: Ambulatory Visit | Attending: Radiation Oncology | Admitting: Radiation Oncology

## 2018-02-09 ENCOUNTER — Ambulatory Visit: Payer: Medicare Other

## 2018-02-09 ENCOUNTER — Encounter: Payer: Self-pay | Admitting: Radiation Oncology

## 2018-02-09 ENCOUNTER — Other Ambulatory Visit: Payer: Self-pay

## 2018-02-09 ENCOUNTER — Ambulatory Visit: Admission: RE | Admit: 2018-02-09 | Payer: Medicare Other | Source: Ambulatory Visit | Admitting: Urology

## 2018-02-09 VITALS — BP 138/73 | HR 65 | Temp 96.0°F | Resp 18 | Wt 203.6 lb

## 2018-02-09 DIAGNOSIS — Z923 Personal history of irradiation: Secondary | ICD-10-CM | POA: Diagnosis not present

## 2018-02-09 DIAGNOSIS — C61 Malignant neoplasm of prostate: Secondary | ICD-10-CM | POA: Insufficient documentation

## 2018-02-09 DIAGNOSIS — C775 Secondary and unspecified malignant neoplasm of intrapelvic lymph nodes: Secondary | ICD-10-CM | POA: Insufficient documentation

## 2018-02-09 SURGERY — ULTRASOUND, PROSTATE, FOR VOLUME DETERMINATION
Anesthesia: Choice

## 2018-02-09 NOTE — Progress Notes (Signed)
Radiation Oncology Volume study Note  Name: Roy Gentry   Date:   02/09/2018 MRN:  [id number] DOB: 1951-02-25    This 67 y.o. 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  COMPLICATIONS OF TREATMENT: none  FOLLOW UP COMPLIANCE: keeps appointments   PHYSICAL EXAM:  There were no vitals taken for this visit. Well-developed well-nourished patient in NAD. HEENT reveals PERLA, EOMI, discs not visualized.  Oral cavity is clear. No oral mucosal lesions are identified. Neck is clear without evidence of cervical or supraclavicular adenopathy. Lungs are clear to A&P. Cardiac examination is essentially unremarkable with regular rate and rhythm without murmur rub or thrill. Abdomen is benign with no organomegaly or masses noted. Motor sensory and DTR levels are equal and symmetric in the upper and lower extremities. Cranial nerves II through XII are grossly intact. Proprioception is intact. No peripheral adenopathy or edema is identified. No motor or sensory levels are noted. Crude visual fields are within normal range.  RADIOLOGY RESULTS: ultrasound guidance used for three-dimensional recreation of his prostate volume  PLAN: Patient was taken to the cystoscopy suite in the OR. Patient was placed in the low lithotomy position. Foley catheter was placed. Trans-rectal ultrasound probe was inserted into the rectum and prostate seminal vesicles were visualized as well as bladder base. stepping images were performed on a 5 mm increments. Images will be placed in  BrachyVision treatment planning system to determine seed placement coordinates for eventual I-125 interstitial implant. Images will be reviewed with the physics and dosimetry staff for final quality approval. I personally was present for the volume study and assisted in delineation of contour volumes.  At the end of the procedure Foley catheter was removed, rectal ultrasound probe was removed. Patient tolerated his procedures extremely well with no side effects or complaints. Patient has given appointment for interstitial implant date. Consent was signed today as well as history and physical performed in preparation for his outpatient surgical implant.      Noreene Filbert, MD

## 2018-02-09 NOTE — H&P (View-Only) (Signed)
Radiation Oncology Volume study Note  Name: Roy Gentry   Date:   02/09/2018 MRN:  [id number] DOB: [birth year]    This 67 y.o. 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  COMPLICATIONS OF TREATMENT: none  FOLLOW UP COMPLIANCE: keeps appointments   PHYSICAL EXAM:  There were no vitals taken for this visit. Well-developed well-nourished patient in NAD. HEENT reveals PERLA, EOMI, discs not visualized.  Oral cavity is clear. No oral mucosal lesions are identified. Neck is clear without evidence of cervical or supraclavicular adenopathy. Lungs are clear to A&P. Cardiac examination is essentially unremarkable with regular rate and rhythm without murmur rub or thrill. Abdomen is benign with no organomegaly or masses noted. Motor sensory and DTR levels are equal and symmetric in the upper and lower extremities. Cranial nerves II through XII are grossly intact. Proprioception is intact. No peripheral adenopathy or edema is identified. No motor or sensory levels are noted. Crude visual fields are within normal range.  RADIOLOGY RESULTS: ultrasound guidance used for three-dimensional recreation of his prostate volume  PLAN: Patient was taken to the cystoscopy suite in the OR. Patient was placed in the low lithotomy position. Foley catheter was placed. Trans-rectal ultrasound probe was inserted into the rectum and prostate seminal vesicles were visualized as well as bladder base. stepping images were performed on a 5 mm increments. Images will be placed in  BrachyVision treatment planning system to determine seed placement coordinates for eventual I-125 interstitial implant. Images will be reviewed with the physics and dosimetry staff for final quality approval. I personally was present for the volume study and assisted in delineation of contour volumes.  At the end of the procedure Foley catheter was removed, rectal ultrasound probe was removed. Patient tolerated his procedures extremely well with no side effects or complaints. Patient has given appointment for interstitial implant date. Consent was signed today as well as history and physical performed in preparation for his outpatient surgical implant.      Noreene Filbert, MD

## 2018-02-09 NOTE — H&P (Signed)
NEW PATIENT EVALUATION  Name: Roy Gentry  MRN: [id number]  Date:   02/09/2018     DOB: [birth year]   This 67 y.o. male patient presents to the clinic for History and physical prior to I-125 interstitial implant for boost for Gleason 8 adenocarcinoma the prostate  REFERRING PHYSICIAN: Baxter Hire, MD  CHIEF COMPLAINT:  Chief Complaint  Patient presents with  . Prostate Cancer    Pt is here for post volume study    DIAGNOSIS: The encounter diagnosis was Malignant neoplasm of prostate ([place]).   PREVIOUS INVESTIGATIONS:  clinical notes reviewed Pathology reports reviewed   HPI: patient is a 67 year old male currently undergoing external beam radiation therapy to his prostate and pelvic nodes for Gleason 8 (4+4) adenocarcinoma. Bone scan and CT scan showed no evidence to suggest metastatic disease.He is been started on androgen deprivation therapy as well as as stated above external beam treatment. He is doing well he is seen today for volume study in anticipation of a I-125 interstitial implant for boost. He is doing well otherwise.  PLANNED TREATMENT REGIMEN: I-125 interstitial implant  PAST MEDICAL HISTORY:  has a past medical history of Asthma and Cancer ([place]).    PAST SURGICAL HISTORY:  Past Surgical History:  Procedure Laterality Date  . TONSILECTOMY, ADENOIDECTOMY, BILATERAL MYRINGOTOMY AND TUBES      FAMILY HISTORY: family history is not on file.  SOCIAL HISTORY:  reports that he has never smoked. He has never used smokeless tobacco. He reports that he drank alcohol. He reports that he does not use drugs.  ALLERGIES: Simvastatin  MEDICATIONS:  Current Outpatient Medications  Medication Sig Dispense Refill  . Melatonin 5 MG CHEW Chew 5 mg by mouth at bedtime.     . Naproxen Sod-diphenhydrAMINE (ALEVE PM) 220-25 MG TABS Take 2 tablets by mouth at bedtime.    . tamsulosin (FLOMAX) 0.4 MG CAPS capsule Take 1 capsule (0.4 mg total) by mouth daily after supper. 30  capsule 6   No current facility-administered medications for this encounter.     ECOG PERFORMANCE STATUS:  0 - Asymptomatic  REVIEW OF SYSTEMS:  Patient denies any weight loss, fatigue, weakness, fever, chills or night sweats. Patient denies any loss of vision, blurred vision. Patient denies any ringing  of the ears or hearing loss. No irregular heartbeat. Patient denies heart murmur or history of fainting. Patient denies any chest pain or pain radiating to her upper extremities. Patient denies any shortness of breath, difficulty breathing at night, cough or hemoptysis. Patient denies any swelling in the lower legs. Patient denies any nausea vomiting, vomiting of blood, or coffee ground material in the vomitus. Patient denies any stomach pain. Patient states has had normal bowel movements no significant constipation or diarrhea. Patient denies any dysuria, hematuria or significant nocturia. Patient denies any problems walking, swelling in the joints or loss of balance. Patient denies any skin changes, loss of hair or loss of weight. Patient denies any excessive worrying or anxiety or significant depression. Patient denies any problems with insomnia. Patient denies excessive thirst, polyuria, polydipsia. Patient denies any swollen glands, patient denies easy bruising or easy bleeding. Patient denies any recent infections, allergies or URI. Patient "s visual fields have not changed significantly in recent time.    PHYSICAL EXAM: BP 138/73 (BP Location: Left Arm, Patient Position: Sitting)   Pulse 65   Temp (!) 96 F (35.6 C) (Tympanic)   Resp 18   Wt 203 lb 9.5 oz (92.4 kg)  BMI 27.61 kg/m  Well-developed well-nourished patient in NAD. HEENT reveals PERLA, EOMI, discs not visualized.  Oral cavity is clear. No oral mucosal lesions are identified. Neck is clear without evidence of cervical or supraclavicular adenopathy. Lungs are clear to A&P. Cardiac examination is essentially unremarkable with  regular rate and rhythm without murmur rub or thrill. Abdomen is benign with no organomegaly or masses noted. Motor sensory and DTR levels are equal and symmetric in the upper and lower extremities. Cranial nerves II through XII are grossly intact. Proprioception is intact. No peripheral adenopathy or edema is identified. No motor or sensory levels are noted. Crude visual fields are within normal range.  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 Biopsy was performed successfully today. Risks and benefits including radiation safety precautions of the procedure were reviewed with the patient and his wife. They both seem to comprehend my treatment plan well. Cuff concent was signed.  I would like to take this opportunity to thank you for allowing me to participate in the care of your patient.Noreene Filbert, MD

## 2018-02-10 ENCOUNTER — Ambulatory Visit
Admission: RE | Admit: 2018-02-10 | Discharge: 2018-02-10 | Disposition: A | Discharge status: Home / Self Care | Payer: Medicare Other | Source: Ambulatory Visit | Attending: Radiation Oncology | Admitting: Radiation Oncology

## 2018-02-10 DIAGNOSIS — C61 Malignant neoplasm of prostate: Secondary | ICD-10-CM | POA: Diagnosis not present

## 2018-02-11 ENCOUNTER — Ambulatory Visit: Payer: Medicare Other

## 2018-02-11 ENCOUNTER — Ambulatory Visit
Admission: RE | Admit: 2018-02-11 | Discharge: 2018-02-11 | Disposition: A | Discharge status: Home / Self Care | Payer: Medicare Other | Source: Ambulatory Visit | Attending: Radiation Oncology | Admitting: Radiation Oncology

## 2018-02-11 DIAGNOSIS — C61 Malignant neoplasm of prostate: Secondary | ICD-10-CM | POA: Diagnosis not present

## 2018-02-12 ENCOUNTER — Ambulatory Visit
Admission: RE | Admit: 2018-02-12 | Discharge: 2018-02-12 | Disposition: A | Discharge status: Home / Self Care | Payer: Medicare Other | Source: Ambulatory Visit | Attending: Radiation Oncology | Admitting: Radiation Oncology

## 2018-02-12 DIAGNOSIS — C61 Malignant neoplasm of prostate: Secondary | ICD-10-CM | POA: Diagnosis not present

## 2018-02-16 DIAGNOSIS — C61 Malignant neoplasm of prostate: Secondary | ICD-10-CM | POA: Diagnosis not present

## 2018-03-02 ENCOUNTER — Other Ambulatory Visit: Payer: Medicare Other

## 2018-03-03 ENCOUNTER — Other Ambulatory Visit: Payer: Self-pay

## 2018-03-03 ENCOUNTER — Encounter
Admission: RE | Admit: 2018-03-03 | Discharge: 2018-03-03 | Disposition: A | Discharge status: Home / Self Care | Payer: Medicare Other | Source: Ambulatory Visit | Attending: Urology | Admitting: Urology

## 2018-03-03 DIAGNOSIS — Z01818 Encounter for other preprocedural examination: Secondary | ICD-10-CM | POA: Insufficient documentation

## 2018-03-03 DIAGNOSIS — C61 Malignant neoplasm of prostate: Secondary | ICD-10-CM

## 2018-03-03 DIAGNOSIS — E785 Hyperlipidemia, unspecified: Secondary | ICD-10-CM | POA: Diagnosis not present

## 2018-03-03 LAB — CBC
HCT: 42.1 % (ref 39.0–52.0)
HEMOGLOBIN: 14 g/dL (ref 13.0–17.0)
MCH: 31 pg (ref 26.0–34.0)
MCHC: 33.3 g/dL (ref 30.0–36.0)
MCV: 93.3 fL (ref 80.0–100.0)
NRBC: 0 % (ref 0.0–0.2)
Platelets: 240 10*3/uL (ref 150–400)
RBC: 4.51 MIL/uL (ref 4.22–5.81)
RDW: 13.3 % (ref 11.5–15.5)
WBC: 4.3 10*3/uL (ref 4.0–10.5)

## 2018-03-03 MED ORDER — FLEET ENEMA 7-19 GM/118ML RE ENEM
1.0000 | ENEMA | Freq: Once | RECTAL | Status: DC
  Filled 2018-03-03: qty 1

## 2018-03-03 NOTE — Pre-Procedure Instructions (Signed)
Fleets enema given to patient.

## 2018-03-03 NOTE — Patient Instructions (Addendum)
Your procedure is scheduled on: 03/09/18 Tues Report to Same Day Surgery 2nd floor medical mall [facility] Entrance-take elevator on left to 2nd floor.  Check in with surgery information desk.) To find out your arrival time please call [phone] between 1PM - 3PM on 03/08/18 Mon  Remember: Instructions that are not followed completely may result in serious medical risk, up to and including death, or upon the discretion of your surgeon and anesthesiologist your surgery may need to be rescheduled.    _x___ 1. Do not eat food after midnight the night before your procedure. You may drink clear liquids up to 2 hours before you are scheduled to arrive at the hospital for your procedure.  Do not drink clear liquids within 2 hours of your scheduled arrival to the hospital.  Clear liquids include  --Water or Apple juice without pulp  --Clear carbohydrate beverage such as ClearFast or Gatorade  --Black Coffee or Clear Tea (No milk, no creamers, do not add anything to                  the coffee or Tea Type 1 and type 2 diabetics should only drink water.   ____Ensure clear carbohydrate drink on the way to the hospital for bariatric patients  ____Ensure clear carbohydrate drink 3 hours before surgery for Dr Dwyane Luo patients if physician instructed.   No gum chewing or hard candies.     __x__ 2. No Alcohol for 24 hours before or after surgery.   __x__3. No Smoking or e-cigarettes for 24 prior to surgery.  Do not use any chewable tobacco products for at least 6 hour prior to surgery   ____  4. Bring all medications with you on the day of surgery if instructed.    __x__ 5. Notify your doctor if there is any change in your medical condition     (cold, fever, infections).    x___6. On the morning of surgery brush your teeth with toothpaste and water.  You may rinse your mouth with mouth wash if you wish.  Do not swallow any toothpaste or mouthwash.   Do not wear jewelry, make-up, hairpins,  clips or nail polish.  Do not wear lotions, powders, or perfumes. You may wear deodorant.  Do not shave 48 hours prior to surgery. Men may shave face and neck.  Do not bring valuables to the hospital.    [facility] is not responsible for any belongings or valuables.               Contacts, dentures or bridgework may not be worn into surgery.  Leave your suitcase in the car. After surgery it may be brought to your room.  For patients admitted to the hospital, discharge time is determined by your                       treatment team.  _  Patients discharged the day of surgery will not be allowed to drive home.  You will need someone to drive you home and stay with you the night of your procedure.    Please read over the following fact sheets that you were given:   [facility] Preparing for Surgery and or MRSA Information   _x___ Take anti-hypertensive listed below, cardiac, seizure, asthma,     anti-reflux and psychiatric medicines. These include:  1. None  2.  3.  4.  5.  6.  _x_Fleets enema or Magnesium Citrate as directed.  _ -__ Use CHG Soap or sage wipes as directed on instruction sheet   ____ Use inhalers on the day of surgery and bring to hospital day of surgery  ____ Stop Metformin and Janumet 2 days prior to surgery.    ____ Take 1/2 of usual insulin dose the night before surgery and none on the morning     surgery.   _x___ Follow recommendations from Cardiologist, Pulmonologist or PCP regarding          stopping Aspirin, Coumadin, Plavix ,Eliquis, Effient, or Pradaxa, and Pletal.  X____Stop Anti-inflammatories such as Advil, Aleve, Ibuprofen, Motrin, Naproxen, Naprosyn, Goodies powders or aspirin products. OK to take Tylenol and                          Celebrex.   _x___ Stop supplements until after surgery.  But may continue Vitamin D, Vitamin B,       and multivitamin.   ____ Bring C-Pap to the hospital.

## 2018-03-08 MED ORDER — CIPROFLOXACIN IN D5W 400 MG/200ML IV SOLN
400.0000 mg | INTRAVENOUS | Status: AC
  Administered 2018-03-09: 400 mg via INTRAVENOUS

## 2018-03-09 ENCOUNTER — Telehealth: Payer: Self-pay | Admitting: Urology

## 2018-03-09 ENCOUNTER — Ambulatory Visit: Payer: Medicare Other | Admitting: Certified Registered Nurse Anesthetist

## 2018-03-09 ENCOUNTER — Encounter
Admission: RE | Disposition: A | Discharge status: Home / Self Care | Payer: Self-pay | Source: Ambulatory Visit | Attending: Urology

## 2018-03-09 ENCOUNTER — Other Ambulatory Visit: Payer: Self-pay

## 2018-03-09 ENCOUNTER — Ambulatory Visit
Admission: RE | Admit: 2018-03-09 | Discharge: 2018-03-09 | Disposition: A | Discharge status: Home / Self Care | Payer: Medicare Other | Source: Ambulatory Visit | Attending: Radiation Oncology | Admitting: Radiation Oncology

## 2018-03-09 ENCOUNTER — Encounter: Payer: Self-pay | Admitting: *Deleted

## 2018-03-09 ENCOUNTER — Ambulatory Visit
Admission: RE | Admit: 2018-03-09 | Discharge: 2018-03-09 | Disposition: A | Discharge status: Home / Self Care | Payer: Medicare Other | Source: Ambulatory Visit | Attending: Urology | Admitting: Urology

## 2018-03-09 DIAGNOSIS — Z923 Personal history of irradiation: Secondary | ICD-10-CM | POA: Insufficient documentation

## 2018-03-09 DIAGNOSIS — Z8709 Personal history of other diseases of the respiratory system: Secondary | ICD-10-CM | POA: Diagnosis not present

## 2018-03-09 DIAGNOSIS — C61 Malignant neoplasm of prostate: Secondary | ICD-10-CM | POA: Insufficient documentation

## 2018-03-09 HISTORY — PX: RADIOACTIVE SEED IMPLANT: SHX5150

## 2018-03-09 HISTORY — DX: Family history of other specified conditions: Z84.89

## 2018-03-09 SURGERY — INSERTION, RADIATION SOURCE, PROSTATE
Anesthesia: General | Site: Prostate

## 2018-03-09 MED ORDER — OXYCODONE HCL 5 MG/5ML PO SOLN: 5.0000 mg | Freq: Once | ORAL | Status: AC | PRN

## 2018-03-09 MED ORDER — FENTANYL CITRATE (PF) 100 MCG/2ML IJ SOLN
INTRAMUSCULAR | Status: AC
  Filled 2018-03-09: qty 2

## 2018-03-09 MED ORDER — HYDROCODONE-ACETAMINOPHEN 5-325 MG PO TABS: 1.0000 | ORAL_TABLET | ORAL | 0 refills | Status: DC | PRN

## 2018-03-09 MED ORDER — SUCCINYLCHOLINE CHLORIDE 20 MG/ML IJ SOLN
INTRAMUSCULAR | Status: AC
  Filled 2018-03-09: qty 1

## 2018-03-09 MED ORDER — FAMOTIDINE 20 MG PO TABS
20.0000 mg | ORAL_TABLET | Freq: Once | ORAL | Status: AC
  Administered 2018-03-09: 20 mg via ORAL

## 2018-03-09 MED ORDER — PROMETHAZINE HCL 25 MG/ML IJ SOLN: 6.2500 mg | INTRAMUSCULAR | Status: DC | PRN

## 2018-03-09 MED ORDER — PROPOFOL 10 MG/ML IV BOLUS
INTRAVENOUS | Status: DC | PRN
  Administered 2018-03-09: 200 mg via INTRAVENOUS
  Administered 2018-03-09 (×2): 10 mg via INTRAVENOUS

## 2018-03-09 MED ORDER — SUCCINYLCHOLINE CHLORIDE 20 MG/ML IJ SOLN
INTRAMUSCULAR | Status: DC | PRN
  Administered 2018-03-09: 120 mg via INTRAVENOUS

## 2018-03-09 MED ORDER — OXYCODONE HCL 5 MG PO TABS
5.0000 mg | ORAL_TABLET | Freq: Once | ORAL | Status: AC | PRN
  Administered 2018-03-09: 5 mg via ORAL

## 2018-03-09 MED ORDER — MEPERIDINE HCL 50 MG/ML IJ SOLN: 6.2500 mg | INTRAMUSCULAR | Status: DC | PRN

## 2018-03-09 MED ORDER — FENTANYL CITRATE (PF) 100 MCG/2ML IJ SOLN
INTRAMUSCULAR | Status: AC
  Administered 2018-03-09: 50 ug via INTRAVENOUS
  Filled 2018-03-09: qty 2

## 2018-03-09 MED ORDER — FENTANYL CITRATE (PF) 100 MCG/2ML IJ SOLN
25.0000 ug | INTRAMUSCULAR | Status: DC | PRN
  Administered 2018-03-09 (×3): 50 ug via INTRAVENOUS

## 2018-03-09 MED ORDER — ONDANSETRON HCL 4 MG/2ML IJ SOLN
INTRAMUSCULAR | Status: DC | PRN
  Administered 2018-03-09: 4 mg via INTRAVENOUS

## 2018-03-09 MED ORDER — DEXAMETHASONE SODIUM PHOSPHATE 10 MG/ML IJ SOLN
INTRAMUSCULAR | Status: DC | PRN
  Administered 2018-03-09: 10 mg via INTRAVENOUS

## 2018-03-09 MED ORDER — PHENYLEPHRINE HCL 10 MG/ML IJ SOLN
INTRAMUSCULAR | Status: DC | PRN
  Administered 2018-03-09: 200 ug via INTRAVENOUS

## 2018-03-09 MED ORDER — DEXAMETHASONE SODIUM PHOSPHATE 10 MG/ML IJ SOLN
INTRAMUSCULAR | Status: AC
  Filled 2018-03-09: qty 1

## 2018-03-09 MED ORDER — BACITRACIN 500 UNIT/GM EX OINT
TOPICAL_OINTMENT | CUTANEOUS | Status: DC | PRN
  Administered 2018-03-09: 1 via TOPICAL

## 2018-03-09 MED ORDER — ONDANSETRON HCL 4 MG/2ML IJ SOLN
INTRAMUSCULAR | Status: AC
  Filled 2018-03-09: qty 2

## 2018-03-09 MED ORDER — MIDAZOLAM HCL 2 MG/2ML IJ SOLN
INTRAMUSCULAR | Status: DC | PRN
  Administered 2018-03-09: 2 mg via INTRAVENOUS

## 2018-03-09 MED ORDER — OXYCODONE HCL 5 MG PO TABS
ORAL_TABLET | ORAL | Status: AC
  Administered 2018-03-09: 5 mg via ORAL
  Filled 2018-03-09: qty 1

## 2018-03-09 MED ORDER — BACITRACIN ZINC 500 UNIT/GM EX OINT
TOPICAL_OINTMENT | CUTANEOUS | Status: AC
  Filled 2018-03-09: qty 28.35

## 2018-03-09 MED ORDER — LACTATED RINGERS IV SOLN
INTRAVENOUS | Status: DC
  Administered 2018-03-09: 07:00:00 via INTRAVENOUS

## 2018-03-09 MED ORDER — FENTANYL CITRATE (PF) 100 MCG/2ML IJ SOLN
INTRAMUSCULAR | Status: DC | PRN
  Administered 2018-03-09: 50 ug via INTRAVENOUS

## 2018-03-09 MED ORDER — LIDOCAINE HCL (CARDIAC) PF 100 MG/5ML IV SOSY
PREFILLED_SYRINGE | INTRAVENOUS | Status: DC | PRN
  Administered 2018-03-09: 100 mg via INTRAVENOUS

## 2018-03-09 MED ORDER — CIPROFLOXACIN IN D5W 400 MG/200ML IV SOLN
INTRAVENOUS | Status: AC
  Filled 2018-03-09: qty 200

## 2018-03-09 MED ORDER — ROCURONIUM BROMIDE 50 MG/5ML IV SOLN
INTRAVENOUS | Status: AC
  Filled 2018-03-09: qty 1

## 2018-03-09 MED ORDER — ROCURONIUM BROMIDE 100 MG/10ML IV SOLN
INTRAVENOUS | Status: DC | PRN
  Administered 2018-03-09: 5 mg via INTRAVENOUS

## 2018-03-09 MED ORDER — PROPOFOL 10 MG/ML IV BOLUS
INTRAVENOUS | Status: AC
  Filled 2018-03-09: qty 20

## 2018-03-09 MED ORDER — LIDOCAINE HCL (PF) 2 % IJ SOLN
INTRAMUSCULAR | Status: AC
  Filled 2018-03-09: qty 10

## 2018-03-09 MED ORDER — MIDAZOLAM HCL 2 MG/2ML IJ SOLN
INTRAMUSCULAR | Status: AC
  Filled 2018-03-09: qty 2

## 2018-03-09 MED ORDER — FAMOTIDINE 20 MG PO TABS
ORAL_TABLET | ORAL | Status: AC
  Filled 2018-03-09: qty 1

## 2018-03-09 SURGICAL SUPPLY — 22 items
BAG URINE DRAINAGE (UROLOGICAL SUPPLIES) ×2 IMPLANT
BLADE CLIPPER SURG (BLADE) ×2 IMPLANT
CATH FOL 2WAY LX 16X5 (CATHETERS) ×2 IMPLANT
COVER WAND RF STERILE (DRAPES) IMPLANT
DRAPE INCISE 23X17 IOBAN STRL (DRAPES) ×1
DRAPE INCISE IOBAN 23X17 STRL (DRAPES) ×1 IMPLANT
DRAPE TABLE BACK 80X90 (DRAPES) ×2 IMPLANT
DRAPE UNDER BUTTOCK W/FLU (DRAPES) ×2 IMPLANT
DRSG TELFA 3X8 NADH (GAUZE/BANDAGES/DRESSINGS) ×2 IMPLANT
GLOVE BIO SURGEON STRL SZ7.5 (GLOVE) ×4 IMPLANT
GLOVE BIO SURGEON STRL SZ8 (GLOVE) ×2 IMPLANT
GOWN STRL REUS W/ TWL LRG LVL3 (GOWN DISPOSABLE) ×1 IMPLANT
GOWN STRL REUS W/ TWL XL LVL3 (GOWN DISPOSABLE) ×2 IMPLANT
GOWN STRL REUS W/TWL LRG LVL3 (GOWN DISPOSABLE) ×1
GOWN STRL REUS W/TWL XL LVL3 (GOWN DISPOSABLE) ×2
KIT TURNOVER CYSTO (KITS) ×2 IMPLANT
PACK CYSTO AR (MISCELLANEOUS) ×2 IMPLANT
SET CYSTO W/LG BORE CLAMP LF (SET/KITS/TRAYS/PACK) ×2 IMPLANT
SURGILUBE 2OZ TUBE FLIPTOP (MISCELLANEOUS) ×2 IMPLANT
SYR 10ML LL (SYRINGE) ×2 IMPLANT
SYRINGE IRR TOOMEY STRL 70CC (SYRINGE) IMPLANT
WATER STERILE IRR 1000ML POUR (IV SOLUTION) ×2 IMPLANT

## 2018-03-09 NOTE — Transfer of Care (Signed)
Immediate Anesthesia Transfer of Care Note  Patient: Roy Gentry  Procedure(s) Performed: RADIOACTIVE SEED IMPLANT/BRACHYTHERAPY IMPLANT (N/A Prostate)  Patient Location: PACU  Anesthesia Type:General  Level of Consciousness: drowsy  Airway & Oxygen Therapy: Patient Spontanous Breathing and Patient connected to face mask oxygen  Post-op Assessment: Report given to RN and Post -op Vital signs reviewed and stable  Post vital signs: Reviewed and stable  Last Vitals:  Vitals Value Taken Time  BP 125/76 03/09/2018  8:52 AM  Temp 36.7 C 03/09/2018  8:52 AM  Pulse 70 03/09/2018  8:52 AM  Resp 16 03/09/2018  8:52 AM  SpO2 98 % 03/09/2018  8:52 AM    Last Pain:  Vitals:   03/09/18 0852  TempSrc: Temporal  PainSc:          Complications: No apparent anesthesia complications

## 2018-03-09 NOTE — Anesthesia Preprocedure Evaluation (Signed)
Anesthesia Evaluation  Patient identified by MRN, date of birth, ID band Patient awake    Reviewed: Allergy & Precautions, NPO status , Patient's Chart, lab work & pertinent test results  History of Anesthesia Complications Negative for: history of anesthetic complications  Airway Mallampati: II  TM Distance: >3 FB Neck ROM: Full    Dental no notable dental hx.    Pulmonary asthma (childhood) , neg sleep apnea,    breath sounds clear to auscultation- rhonchi (-) wheezing      Cardiovascular Exercise Tolerance: Good (-) hypertension(-) CAD, (-) Past MI, (-) Cardiac Stents and (-) CABG  Rhythm:Regular Rate:Normal - Systolic murmurs and - Diastolic murmurs    Neuro/Psych negative neurological ROS  negative psych ROS   GI/Hepatic negative GI ROS, Neg liver ROS,   Endo/Other  negative endocrine ROSneg diabetes  Renal/GU negative Renal ROS     Musculoskeletal negative musculoskeletal ROS (+)   Abdominal (+) - obese,   Peds  Hematology negative hematology ROS (+)   Anesthesia Other Findings Past Medical History: No date: Asthma No date: Cancer ([place]) No date: Family history of adverse reaction to anesthesia     Comment:  ponv   Reproductive/Obstetrics                             Anesthesia Physical Anesthesia Plan  ASA: II  Anesthesia Plan: General   Post-op Pain Management:    Induction: Intravenous  PONV Risk Score and Plan: 1 and Ondansetron and Midazolam  Airway Management Planned: Oral ETT  Additional Equipment:   Intra-op Plan:   Post-operative Plan: Extubation in OR  Informed Consent: I have reviewed the patients History and Physical, chart, labs and discussed the procedure including the risks, benefits and alternatives for the proposed anesthesia with the patient or authorized representative who has indicated his/her understanding and acceptance.   Dental advisory  given  Plan Discussed with: CRNA and Anesthesiologist  Anesthesia Plan Comments:         Anesthesia Quick Evaluation

## 2018-03-09 NOTE — Anesthesia Post-op Follow-up Note (Signed)
Anesthesia QCDR form completed.        

## 2018-03-09 NOTE — Anesthesia Postprocedure Evaluation (Signed)
Anesthesia Post Note  Patient: Roy Gentry  Procedure(s) Performed: RADIOACTIVE SEED IMPLANT/BRACHYTHERAPY IMPLANT (N/A Prostate)  Patient location during evaluation: PACU Anesthesia Type: General Level of consciousness: awake and alert and oriented Pain management: pain level controlled Vital Signs Assessment: post-procedure vital signs reviewed and stable Respiratory status: spontaneous breathing, nonlabored ventilation and respiratory function stable Cardiovascular status: blood pressure returned to baseline and stable Postop Assessment: no signs of nausea or vomiting Anesthetic complications: no     Last Vitals:  Vitals:   03/09/18 0925 03/09/18 0936  BP:  129/86  Pulse: 62 69  Resp: 14 (!) 23  Temp:  36.6 C  SpO2: 98% 95%    Last Pain:  Vitals:   03/09/18 0936  TempSrc:   PainSc: 3                  Rolonda Pontarelli

## 2018-03-09 NOTE — Telephone Encounter (Signed)
-----   Message from Abbie Sons, MD sent at 03/09/2018  8:49 AM EDT ----- Will you leave 4 boxes of Myrbetriq 50 mg samples for this patient at front desk?  His wife will stop by to pick up before discharge today.  Thanks

## 2018-03-09 NOTE — Interval H&P Note (Signed)
History and Physical Interval Note:  03/09/2018 7:29 AM  Roy Gentry  has presented today for surgery, with the diagnosis of prostate cancer  The various methods of treatment have been discussed with the patient and family. After consideration of risks, benefits and other options for treatment, the patient has consented to  Procedure(s): RADIOACTIVE SEED IMPLANT/BRACHYTHERAPY IMPLANT (N/A) as a surgical intervention .  The patient's history has been reviewed, patient examined, no change in status, stable for surgery.  I have reviewed the patient's chart and labs.  Questions were answered to the patient's satisfaction.     [place]

## 2018-03-09 NOTE — Progress Notes (Signed)
   03/09/18 0700  Clinical Encounter Type  Visited With Patient;Family  Visit Type Initial;Spiritual support  Referral From Nurse  Recommendations Follow-up, as needed.  Spiritual Encounters  Spiritual Needs Prayer;Emotional  Stress Factors  Patient Stress Factors Health changes   As the request of his sister, Chaplain met with the patient and his wife and offered emotional support, prayer, and presence. Patient is positive and eager to complete treatment.

## 2018-03-09 NOTE — Op Note (Signed)
Preoperative diagnosis: Adenocarcinoma of the prostate   Postoperative diagnosis: Adenocarcinoma the prostate  Procedure: I-125 prostate interstitial implant, cystoscopy  Surgeon: John Giovanni, M.D.   Radiation oncologist: Lavena Stanford, M.D.   Anesthesia: General  Drains: none  Complications: none  Indications:  67 year old male with T2b high risk prostate cancer treated with ADT and external beam radiotherapy who presents today for I-125 interstitial implant for prostate boost.  He recently underwent volume study.  Procedure: Patient was brought to operating suite and placement table in the supine position. At this time, a universal timeout protocol was performed, all team members were identified, Venodyne boots are placed, and he was administered IV Cipro in the preoperative period. He was placed in lithotomy position and prepped and draped in usual manner.   Radiation oncology department placed a transrectal ultrasound probe anchoring stand/ grid and aligned with previous imaging from the volume study. Foley catheter was inserted without difficulty.  All needle passage was done with real-time transrectal ultrasound guidance in both the transverse and sagittal plains in order to achieve the desired preplanned position. A total of 20 needles were placed.  68 active seeds were implanted. The Foley catheter was removed and a rigid cystoscopy failed to show any seeds outside the prostate without evidence of trauma to the urethral, prostatic fossa, or bladder.  There was mild lateral lobe enlargement and fairly significant bladder neck elevation.  The bladder was drained.  A fluoroscopic image was then obtained showing excellent distrubution of the brachytherapy seeds.  Each seed was counted and counts were correct.    The patient was then repositioned in the supine position, reversed from anesthesia, and taken to the PACU in stable condition.  John Giovanni, MD

## 2018-03-09 NOTE — Telephone Encounter (Signed)
The scan will be set up by Dr. Baruch Gouty

## 2018-03-09 NOTE — Discharge Instructions (Signed)
AMBULATORY SURGERY  DISCHARGE INSTRUCTIONS   1) The drugs that you were given will stay in your system until tomorrow so for the next 24 hours you should not:  A) Drive an automobile B) Make any legal decisions C) Drink any alcoholic beverage   2) You may resume regular meals tomorrow.  Today it is better to start with liquids and gradually work up to solid foods.  You may eat anything you prefer, but it is better to start with liquids, then soup and crackers, and gradually work up to solid foods.   3) Please notify your doctor immediately if you have any unusual bleeding, trouble breathing, redness and pain at the surgery site, drainage, fever, or pain not relieved by medication.    4) Additional Instructions:        Please contact your physician with any problems or Same Day Surgery at [phone], Monday through Friday 6 am to 4 pm, or [place] at [facility] number at [phone].  Brachytherapy for Prostate Cancer, Care After Refer to this sheet in the next few weeks. These instructions provide you with information on caring for yourself after your procedure. Your health care provider may also give you more specific instructions. Your treatment has been planned according to current medical practices, but problems sometimes occur. Call your health care provider if you have any problems or questions after your procedure. What can I expect after the procedure? The area behind the scrotum will probably be tender and bruised. For a short period of time you may have:  Difficulty passing urine. You may need a catheter for a few days to a month.  Blood in the urine or semen.  A feeling of constipation because of prostate swelling.  Frequent feeling of an urgent need to urinate.  For a long period of time you may have:  Inflammation of the rectum. This happens in about 2% of people who have the procedure.  Erection problems. These vary with age and occur in  about 15-40% of men.  Difficulty urinating. This is caused by scarring in the urethra.  Diarrhea.  Follow these instructions at home:  Take medicines only as directed by your health care provider.  You will probably have a catheter in your bladder for several days. You will have blood in the urine bag and should drink a lot of fluids to keep it a light red color.  Keep all follow-up visits as directed by your health care provider. If you have a catheter, it will be removed during one of these visits.  Try not to sit directly on the area behind the scrotum. A soft cushion can decrease the discomfort. Ice packs may also be helpful for the discomfort. Do not put ice directly on the skin.  Shower and wash the area behind the scrotum gently. Do not sit in a tub.  If you have had the brachytherapy that uses the seeds, limit your close contact with children and pregnant women for 2 months because of the radiation still in the prostate. After that period of time, the levels drop off quickly. Get help right away if:  You have a fever.  You have chills.  You have shortness of breath.  You have chest pain.  You have thick blood, like tomato juice, in the urine bag.  Your catheter is blocked so urine cannot get into the bag. Your bladder area or lower abdomen may be swollen.  There is excessive bleeding from your rectum.  It is normal to have a little blood mixed with your stool.  There is severe discomfort in the treated area that does not go away with pain medicine.  You have abdominal discomfort.  You have severe nausea or vomiting.  You develop any new or unusual symptoms. This information is not intended to replace advice given to you by your health care provider. Make sure you discuss any questions you have with your health care provider. Document Released: 06/14/2010 Document Revised: 10/24/2015 Document Reviewed: 11/02/2012 Elsevier Interactive Patient Education  2017 [employer].

## 2018-03-09 NOTE — Progress Notes (Signed)
Radiation Oncology Follow up Note  Name: Roy Gentry   Date:   03/09/2018 MRN:  [id number] DOB: 1951/01/29    This 67 y.o. 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 He was seen today and underwent I-125 interstitial implant for boost..  COMPLICATIONS OF TREATMENT: none  FOLLOW UP COMPLIANCE: keeps appointments   PHYSICAL EXAM:  There were no vitals taken for this visit. Well-developed well-nourished patient in NAD. HEENT reveals PERLA, EOMI, discs not visualized.  Oral cavity is clear. No oral mucosal lesions are identified. Neck is clear without evidence of cervical or supraclavicular adenopathy. Lungs are clear to A&P. Cardiac examination is essentially unremarkable with regular rate and rhythm without murmur rub or thrill. Abdomen is benign with no organomegaly or masses noted. Motor sensory and DTR levels are equal and symmetric in the upper and lower extremities. Cranial nerves II through XII are grossly intact. Proprioception is intact. No peripheral adenopathy or edema is identified. No motor or sensory levels are noted. Crude visual fields are within normal range.  RADIOLOGY RESULTS: ultrasound and plain films used for source placement as well as quality assurance seed count   PLAN: patient was taken to the operating room and general anesthesia was administered. Legs were immobilized in stirrups and patient was positioned in the exact same proportions as original volume study. Patient was prepped and Foley catheter was placed. Ultrasound guidance identified the prostate and recreated the  original set up as per treatment planning volume study. 20 needles were placed under ultrasound guidance with PVCs delivered to the prostate volume. After completion of procedure cystoscopy was performed by urology and no evidence of seeds in the bladder were noted. Patient tolerated the procedure extremely well. Initial plain film as doublecheck identified 68 seeds in the prostate. Patient has followup appointment in one month for CT scan for quality assurance will be performed.     Noreene Filbert, MD

## 2018-03-09 NOTE — Anesthesia Procedure Notes (Signed)
Procedure Name: Intubation Date/Time: 03/09/2018 7:51 AM Performed by: Johnna Acosta, CRNA Pre-anesthesia Checklist: Patient identified, Emergency Drugs available, Suction available, Patient being monitored and Timeout performed Patient Re-evaluated:Patient Re-evaluated prior to induction Oxygen Delivery Method: Circle system utilized Preoxygenation: Pre-oxygenation with 100% oxygen Induction Type: IV induction Ventilation: Mask ventilation without difficulty and Oral airway inserted - appropriate to patient size Laryngoscope Size: Sabra Heck and 2 Grade View: Grade I Tube type: Oral Tube size: 7.5 mm Number of attempts: 1 Airway Equipment and Method: Stylet Placement Confirmation: ETT inserted through vocal cords under direct vision,  positive ETCO2 and breath sounds checked- equal and bilateral Secured at: 22 cm Tube secured with: Tape Dental Injury: Teeth and Oropharynx as per pre-operative assessment

## 2018-03-09 NOTE — Telephone Encounter (Signed)
Pt's wife stopped by office for Myrbetriq samples and said pt also was supposed to get a scan.  We didn't see anything about that.

## 2018-03-09 NOTE — Telephone Encounter (Signed)
Left up front 4 bxs of Myrbetriq Lot [id number] Exp 12-21 50mg  Per Shawnie Pons

## 2018-04-06 ENCOUNTER — Other Ambulatory Visit: Payer: Self-pay | Admitting: *Deleted

## 2018-04-06 ENCOUNTER — Ambulatory Visit
Admission: RE | Admit: 2018-04-06 | Discharge: 2018-04-06 | Disposition: A | Discharge status: Home / Self Care | Payer: Medicare Other | Source: Ambulatory Visit | Attending: Radiation Oncology | Admitting: Radiation Oncology

## 2018-04-06 ENCOUNTER — Other Ambulatory Visit: Payer: Self-pay

## 2018-04-06 ENCOUNTER — Encounter: Payer: Self-pay | Admitting: Radiation Oncology

## 2018-04-06 VITALS — BP 119/67 | HR 65 | Temp 98.0°F | Wt 216.1 lb

## 2018-04-06 DIAGNOSIS — C61 Malignant neoplasm of prostate: Secondary | ICD-10-CM | POA: Insufficient documentation

## 2018-04-06 DIAGNOSIS — Z923 Personal history of irradiation: Secondary | ICD-10-CM | POA: Insufficient documentation

## 2018-04-06 DIAGNOSIS — Z510 Encounter for antineoplastic radiation therapy: Secondary | ICD-10-CM | POA: Insufficient documentation

## 2018-04-06 NOTE — Progress Notes (Signed)
Radiation Oncology Follow up Note  Name: Roy Gentry   Date:   04/06/2018 MRN:  [id number] DOB: 1951/03/08    This 67 y.o. male presents to the clinic today for one-month follow-up status post I-125 interstitial implant is boost for locally advanced Gleason 8 adenocarcinoma the prostate.  REFERRING PROVIDER: Baxter Hire, MD  HPI: patient is a 67 year old male now out 1 month having completed I-125 interstitial implant for Gleason 8 (4+4) adenocarcinoma the prostate. He is seen today in routine follow-up is doing well. He states his bowels are still somewhat abnormal although improving. He specifically denies urinary frequency or urgency. He does have rectal urgency..  COMPLICATIONS OF TREATMENT: none  FOLLOW UP COMPLIANCE: keeps appointments   PHYSICAL EXAM:  BP 119/67 (BP Location: Left Arm, Patient Position: Sitting)   Pulse 65   Temp 98 F (36.7 C) (Tympanic)   Wt 216 lb 0.8 oz (98 kg)   BMI 29.30 kg/m  On rectal exam rectal sphincter tone is good. Prostate is smooth contracted without evidence of nodularity or mass. Sulcus is preserved bilaterally. No discrete nodularity is identified. No other rectal abnormalities are noted.no evidence of seeds are noted on prostate exam.Well-developed well-nourished patient in NAD. HEENT reveals PERLA, EOMI, discs not visualized.  Oral cavity is clear. No oral mucosal lesions are identified. Neck is clear without evidence of cervical or supraclavicular adenopathy. Lungs are clear to A&P. Cardiac examination is essentially unremarkable with regular rate and rhythm without murmur rub or thrill. Abdomen is benign with no organomegaly or masses noted. Motor sensory and DTR levels are equal and symmetric in the upper and lower extremities. Cranial nerves II through XII are grossly intact. Proprioception is intact. No peripheral adenopathy or edema is identified. No motor or sensory levels are noted. Crude visual fields are within normal  range.  RADIOLOGY RESULTS: CT scan for quality assurance performed an initial review shows excellent placement resources full QA will be performed.  PLAN: present time patient is doing well to continue androgen deprivation therapy through urologist office. I have asked to see him back in 3-4 months with a PSA prior to that visit. I am please was overall progress. Patient is to call with any concerns.  I would like to take this opportunity to thank you for allowing me to participate in the care of your patient.Noreene Filbert, MD

## 2018-04-07 DIAGNOSIS — Z510 Encounter for antineoplastic radiation therapy: Secondary | ICD-10-CM | POA: Diagnosis not present

## 2018-04-07 DIAGNOSIS — C61 Malignant neoplasm of prostate: Secondary | ICD-10-CM | POA: Diagnosis not present

## 2018-04-13 DIAGNOSIS — C61 Malignant neoplasm of prostate: Secondary | ICD-10-CM | POA: Diagnosis not present

## 2018-07-05 ENCOUNTER — Encounter: Payer: Self-pay | Admitting: Urology

## 2018-07-05 ENCOUNTER — Inpatient Hospital Stay: Payer: Medicare Other | Attending: Radiation Oncology

## 2018-07-05 ENCOUNTER — Ambulatory Visit (INDEPENDENT_AMBULATORY_CARE_PROVIDER_SITE_OTHER): Payer: Medicare Other | Admitting: Urology

## 2018-07-05 DIAGNOSIS — C61 Malignant neoplasm of prostate: Secondary | ICD-10-CM

## 2018-07-05 LAB — PSA: Prostatic Specific Antigen: 0.02 ng/mL (ref 0.00–4.00)

## 2018-07-05 MED ORDER — LEUPROLIDE ACETATE (6 MONTH) 45 MG IM KIT
45.0000 mg | PACK | Freq: Once | INTRAMUSCULAR | Status: AC
  Administered 2018-07-05: 45 mg via INTRAMUSCULAR

## 2018-07-05 NOTE — Patient Instructions (Signed)

## 2018-07-05 NOTE — Progress Notes (Signed)
Lupron IM Injection   Due to Prostate Cancer patient is present today for a Lupron Injection.  Medication: Lupron 6 month Dose: 45 mg  Location: right upper outer buttocks Lot: [id number] Exp: 08/12/2020  Patient tolerated well, no complications were noted, informed pt of the need to begin calcium and vitamin d supplementation as patient states he was not aware  Performed by: Gordy Clement, CMA ([place])  Follow up: RTC in 6 mos or before if needed

## 2018-07-12 ENCOUNTER — Other Ambulatory Visit: Payer: Self-pay

## 2018-07-12 ENCOUNTER — Encounter: Payer: Self-pay | Admitting: Radiation Oncology

## 2018-07-12 ENCOUNTER — Ambulatory Visit
Admission: RE | Admit: 2018-07-12 | Discharge: 2018-07-12 | Disposition: A | Discharge status: Home / Self Care | Payer: Medicare Other | Source: Ambulatory Visit | Attending: Radiation Oncology | Admitting: Radiation Oncology

## 2018-07-12 VITALS — BP 123/72 | HR 61 | Temp 94.7°F | Resp 16 | Wt 214.3 lb

## 2018-07-12 DIAGNOSIS — R197 Diarrhea, unspecified: Secondary | ICD-10-CM | POA: Insufficient documentation

## 2018-07-12 DIAGNOSIS — Z923 Personal history of irradiation: Secondary | ICD-10-CM | POA: Diagnosis not present

## 2018-07-12 DIAGNOSIS — C61 Malignant neoplasm of prostate: Secondary | ICD-10-CM | POA: Diagnosis not present

## 2018-07-12 DIAGNOSIS — K5900 Constipation, unspecified: Secondary | ICD-10-CM | POA: Insufficient documentation

## 2018-07-12 NOTE — Progress Notes (Signed)
Radiation Oncology Follow up Note  Name: Roy Gentry   Date:   07/12/2018 MRN:  [id number] DOB: 07-06-1950    This 68 y.o. male presents to the clinic today for four-month follow-up status post I-125 interstitial implant for Gleason 8 adenocarcinoma the prostate.  REFERRING PROVIDER: Baxter Hire, MD  HPI: patient is a 68 year old male now out 4 months having completed I-125 interstitial implant for boost. He's been on androgen deprivation therapy as well as receiving external beam radiation to his prostate and pelvic nodes for stage IIB adenocarcinoma. Seen today in routine follow-up he is doing well. States his bowels still has some slight intermittent diarrhea although also has intermittent constipation. No increased lower urinary tractsymptoms. Most recent PSA was 5.85.  COMPLICATIONS OF TREATMENT: none  FOLLOW UP COMPLIANCE: keeps appointments   PHYSICAL EXAM:  BP 123/72 (BP Location: Left Arm, Patient Position: Sitting)   Pulse 61   Temp (!) 94.7 F (34.8 C) (Tympanic)   Resp 16   Wt 214 lb 4.6 oz (97.2 kg)   BMI 29.06 kg/m  Well-developed well-nourished patient in NAD. HEENT reveals PERLA, EOMI, discs not visualized.  Oral cavity is clear. No oral mucosal lesions are identified. Neck is clear without evidence of cervical or supraclavicular adenopathy. Lungs are clear to A&P. Cardiac examination is essentially unremarkable with regular rate and rhythm without murmur rub or thrill. Abdomen is benign with no organomegaly or masses noted. Motor sensory and DTR levels are equal and symmetric in the upper and lower extremities. Cranial nerves II through XII are grossly intact. Proprioception is intact. No peripheral adenopathy or edema is identified. No motor or sensory levels are noted. Crude visual fields are within normal range.  RADIOLOGY RESULTS: no currentfilms for review  PLAN: present time patient is doing well with low side effect profile under excellent biochemical  control of his prostate cancer. He continues androgen deprivation therapy to Dr. Cherrie Gauze office. I'm otherwise please was overall progress. I've asked to see him back in 6 months and will attempt to obtain a PSA at that time. Patient is to call with any concerns at any time.  I would like to take this opportunity to thank you for allowing me to participate in the care of your patient.Noreene Filbert, MD

## 2018-07-21 ENCOUNTER — Other Ambulatory Visit: Payer: Self-pay | Admitting: Radiation Oncology

## 2018-12-16 IMAGING — CT CT ABD-PELV W/ CM
2 of 5 series · 15 of 46 positions shown, 17 images · IV contrast (iopamidol)
Comparison: None.

CLINICAL DATA: Recent diagnosis of prostate cancer, staging workup

EXAM:
CT ABDOMEN AND PELVIS WITH CONTRAST
TECHNIQUE: Multidetector CT imaging of the abdomen and pelvis was performed
using the standard protocol following bolus administration of
intravenous contrast.
CONTRAST:  100mL YWXJ1D-YSS IOPAMIDOL (YWXJ1D-YSS) INJECTION 61%

[Series 4: abd pelvis · axial · 0.88mm/px · z∈[-1719,-1219]mm · 12 of 112 slices shown, 14 images (1 of 2)]
[im 6/112  soft-tissue]
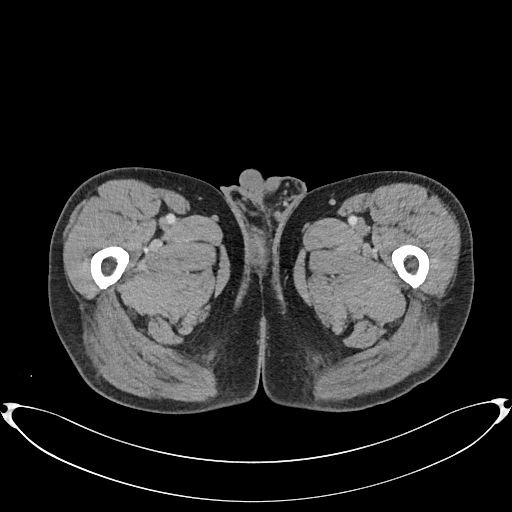
[im 6/112  bone]
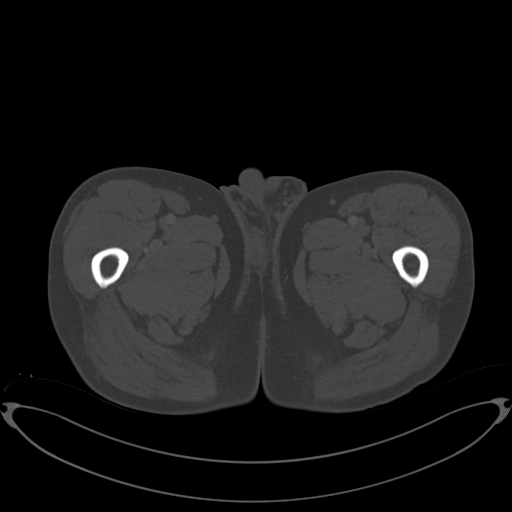
[im 18/112  soft-tissue]
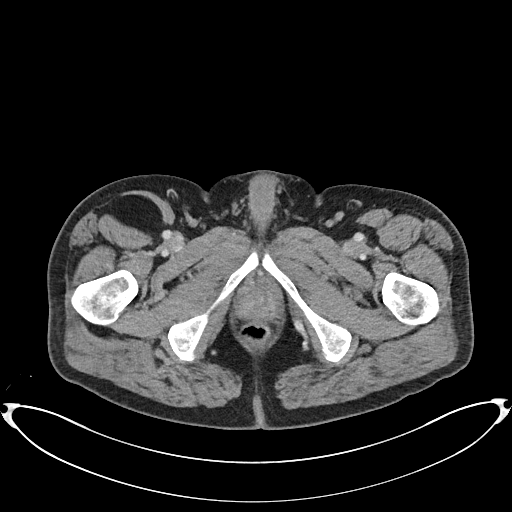
[im 24/112  soft-tissue]
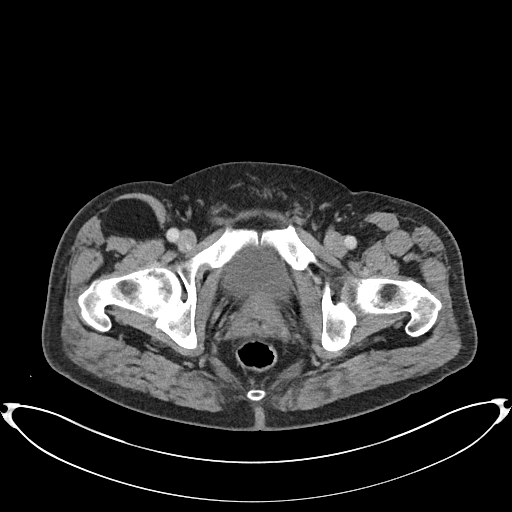
[im 36/112  soft-tissue]
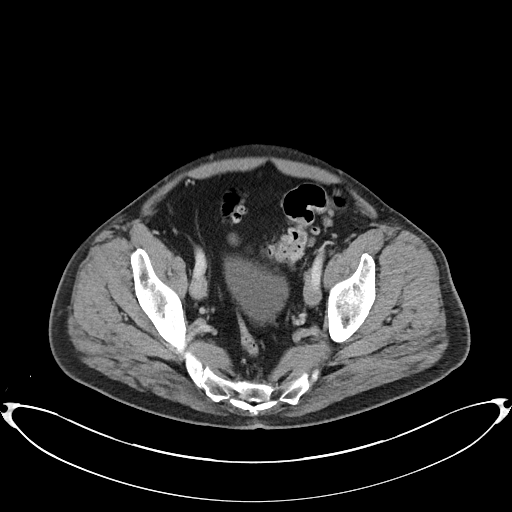
[im 41/112  soft-tissue]
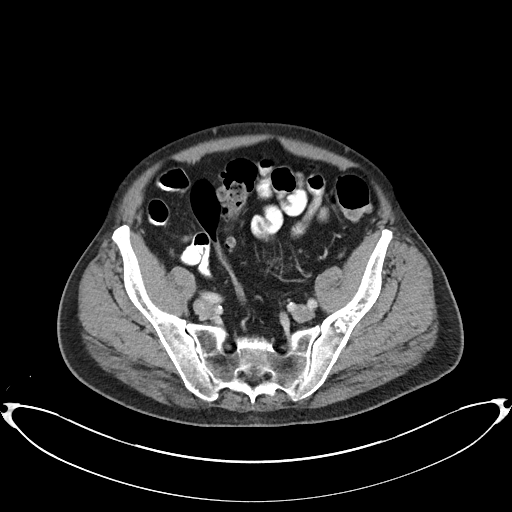
[im 53/112  soft-tissue]
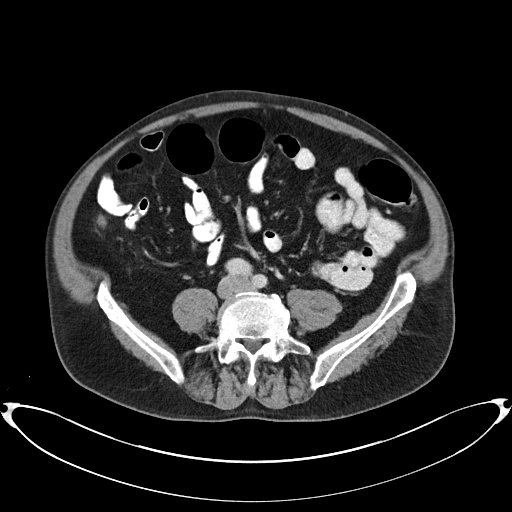
[im 59/112  soft-tissue]
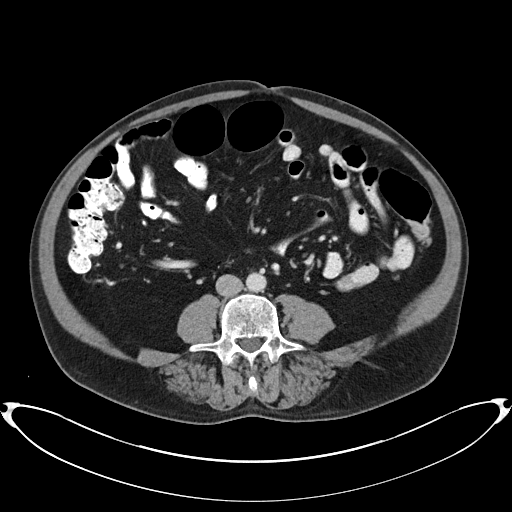
[im 71/112  soft-tissue]
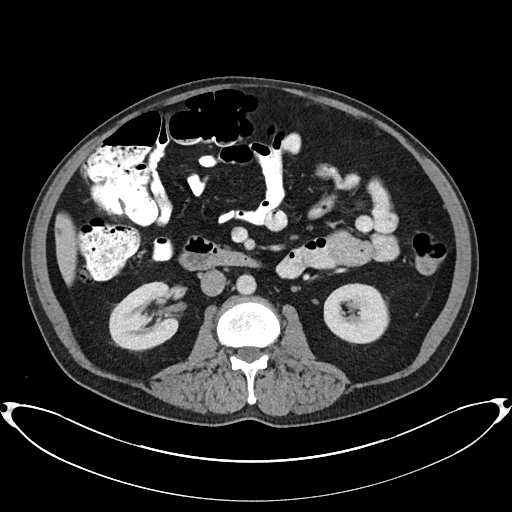
[im 76/112  soft-tissue]
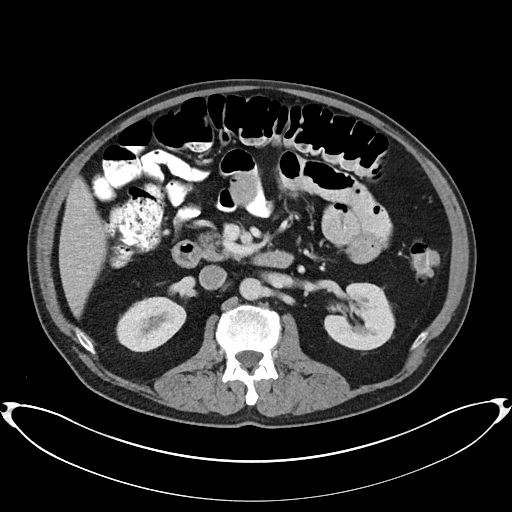
[im 76/112  bone]
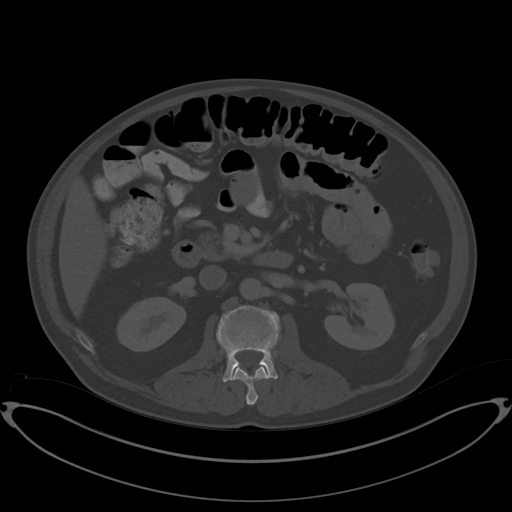
[im 88/112  soft-tissue]
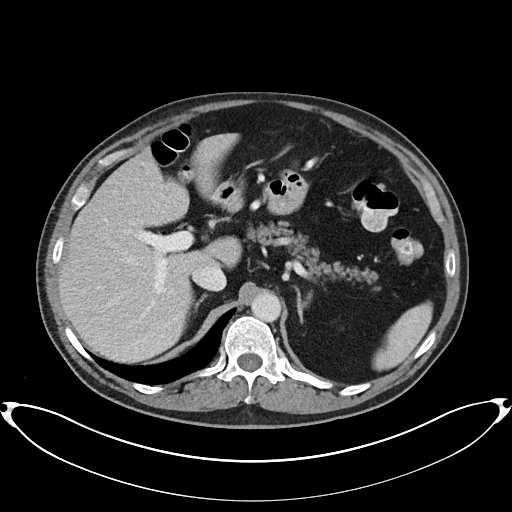
[im 94/112  soft-tissue]
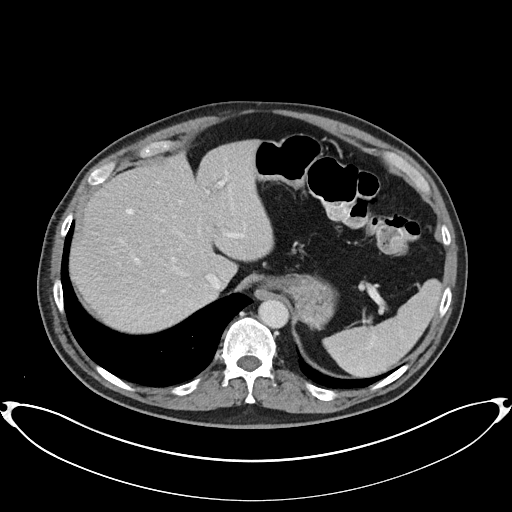
[im 106/112  soft-tissue]
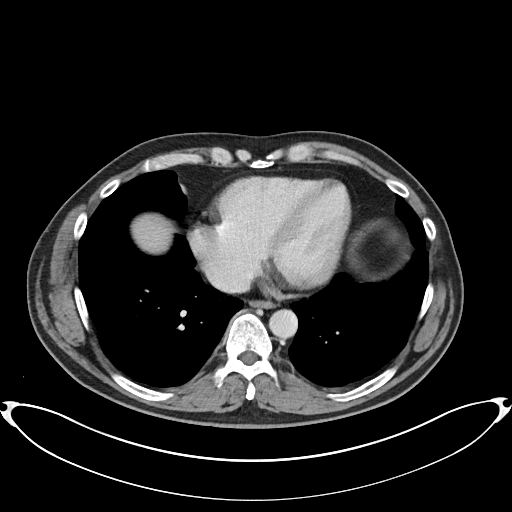

[Series 6: abd pelvis · coronal · 0.79mm/px · 3 of 156 slices shown (2 of 2)]
[im 52/156  soft-tissue]
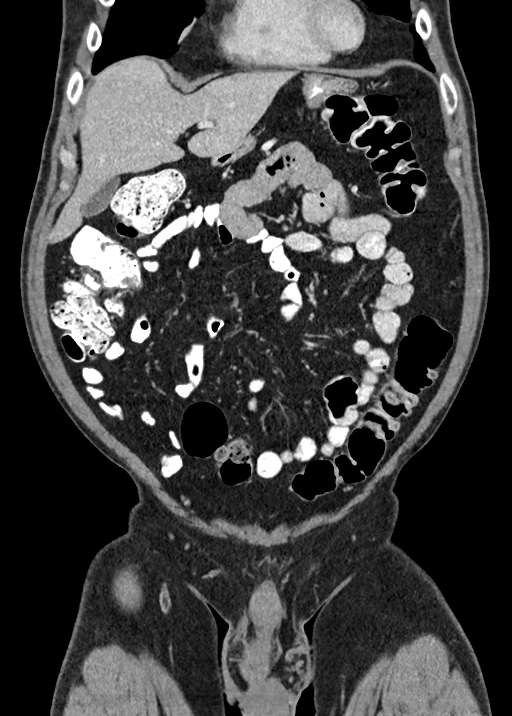
[im 69/156  soft-tissue]
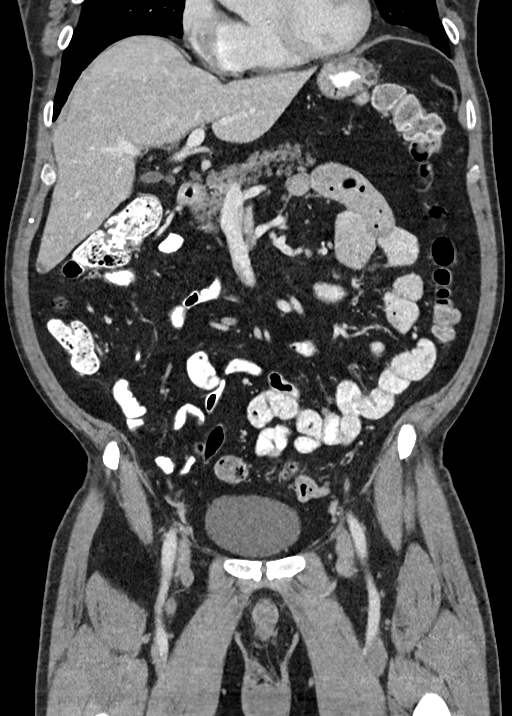
[im 87/156  soft-tissue]
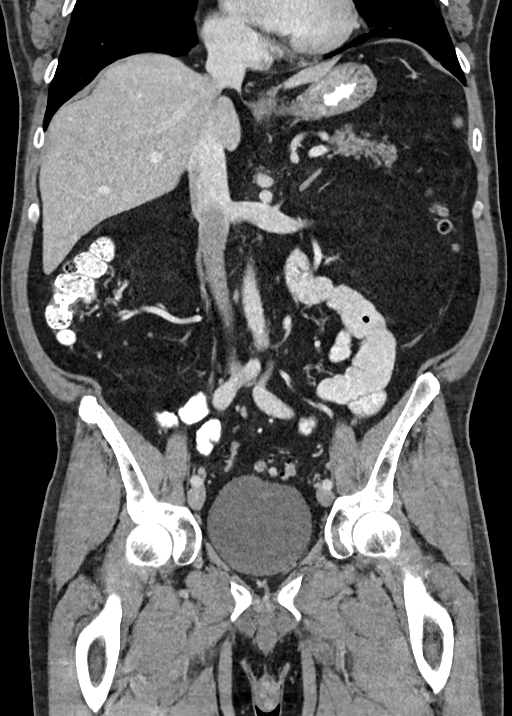

[15 of 46 positions shown; findings below may reference images not displayed]

FINDINGS: Lower chest: Scarring or mild atelectasis in the lingula. Fatty
density along the left ventricular apex potentially from prior
myocardial infarction or apical thinning.

Hepatobiliary: 4 mm lesion in the dome of the liver on image [DATE]
and separate 6 mm lesion inferiorly in the right hepatic lobe on
image 43/4, both are technically too small to characterize, although
statistically likely to be benign.

Pancreas: Unremarkable

Spleen: Unremarkable

Adrenals/Urinary Tract: Unremarkable

Stomach/Bowel: Sigmoid colon diverticulosis. Mildly redundant
sigmoid colon.

Vascular/Lymphatic: Aortoiliac atherosclerotic vascular disease. No
pathologic adenopathy is identified. A left external iliac node
measures 0.7 cm in short axis on image 75/4. Right external iliac
node 0.5 cm in short axis, image 74/4.

Reproductive: The prostate gland measures 5.9 by 3.6 by 4.6 cm
(volume = 51 cm^3). No significant asymmetry of the seminal
vesicles. No obvious asymmetric lobularity of the prostate gland.

Other: No supplemental non-categorized findings.

Musculoskeletal: A lipoma between the right sartorius and rectus
femoris muscles measures 3.8 by 5.6 by 9.0 cm.

Suspected chondroid lesion in the left iliac bone, with benign
imaging characteristics. Right eccentric hemangioma at L1.
Degenerative disc disease and spurring at the L5-S1 level without
overt impingement.
IMPRESSION: 1. Mild prostatomegaly. No findings of overt adenopathy or definite
sclerotic metastatic lesion.
2. Two tiny hypodense lesions in the liver are technically
nonspecific due to small size, although statistically likely to be
benign.
3. Other imaging findings of potential clinical significance: Aortic
Atherosclerosis ([id number]). Fatty density along the left
ventricular apex could potentially be from a prior myocardial
infarction. Sigmoid diverticulosis. Lipoma in the right upper
anterior thigh.

## 2019-01-03 ENCOUNTER — Telehealth: Payer: Self-pay | Admitting: Urology

## 2019-01-03 NOTE — Telephone Encounter (Signed)
NO PA REQUIRED 01-03-19 MICHELLE

## 2019-01-04 ENCOUNTER — Ambulatory Visit: Payer: Medicare Other

## 2019-01-06 ENCOUNTER — Ambulatory Visit (INDEPENDENT_AMBULATORY_CARE_PROVIDER_SITE_OTHER): Payer: Medicare Other | Admitting: Physician Assistant

## 2019-01-06 ENCOUNTER — Other Ambulatory Visit: Payer: Self-pay

## 2019-01-06 ENCOUNTER — Telehealth: Payer: Self-pay | Admitting: Family Medicine

## 2019-01-06 DIAGNOSIS — C61 Malignant neoplasm of prostate: Secondary | ICD-10-CM

## 2019-01-06 MED ORDER — LEUPROLIDE ACETATE (6 MONTH) 45 MG IM KIT
45.0000 mg | PACK | Freq: Once | INTRAMUSCULAR | Status: AC
  Administered 2019-01-06: 45 mg via INTRAMUSCULAR

## 2019-01-06 NOTE — Telephone Encounter (Signed)
Error

## 2019-01-06 NOTE — Progress Notes (Signed)
Lupron IM Injection   Due to Prostate Cancer patient is present today for a Lupron Injection.  He reports that he is not taking the recommended vitamin D and calcium supplements, saying that they are causing him nausea.  I counseled him that it is very important for him to take the supplements to avoid bone loss and related fractures associated with his treatment.  I counseled him that he could take this supplement with food to ease digestive discomfort.  Medication: Lupron 6 month Dose: 45 mg  Location: right upper outer buttocks Lot: [id number] Exp: 01/04/2021  Patient tolerated well, no complications were noted  Performed by: Debroah Loop, PA-C

## 2019-01-10 ENCOUNTER — Other Ambulatory Visit: Payer: Self-pay

## 2019-01-10 ENCOUNTER — Inpatient Hospital Stay: Payer: Medicare Other | Attending: Radiation Oncology

## 2019-01-10 DIAGNOSIS — C61 Malignant neoplasm of prostate: Secondary | ICD-10-CM

## 2019-01-10 LAB — PSA: Prostatic Specific Antigen: 0.01 ng/mL (ref 0.00–4.00)

## 2019-01-17 ENCOUNTER — Other Ambulatory Visit: Payer: Self-pay

## 2019-01-17 ENCOUNTER — Encounter: Payer: Self-pay | Admitting: Radiation Oncology

## 2019-01-17 ENCOUNTER — Ambulatory Visit
Admission: RE | Admit: 2019-01-17 | Discharge: 2019-01-17 | Disposition: A | Discharge status: Home / Self Care | Payer: Medicare Other | Source: Ambulatory Visit | Attending: Radiation Oncology | Admitting: Radiation Oncology

## 2019-01-17 VITALS — BP 148/87 | HR 63 | Temp 95.4°F | Resp 18 | Wt 212.1 lb

## 2019-01-17 DIAGNOSIS — C61 Malignant neoplasm of prostate: Secondary | ICD-10-CM | POA: Diagnosis present

## 2019-01-17 DIAGNOSIS — Z923 Personal history of irradiation: Secondary | ICD-10-CM | POA: Insufficient documentation

## 2019-01-17 DIAGNOSIS — K5900 Constipation, unspecified: Secondary | ICD-10-CM | POA: Insufficient documentation

## 2019-01-17 NOTE — Progress Notes (Signed)
Radiation Oncology Follow up Note  Name: Roy Gentry   Date:   01/17/2019 MRN:  [id number] DOB: [birth year]    This 68 y.o. male presents to the clinic today for 66-month follow-up status post I-125 interstitial implant for boost as well as prostate and pelvic external beam radiation therapy for a Gleason 8 (4+4) adenocarcinoma patient is currently on androgen deprivation therapy.  REFERRING PROVIDER: Baxter Hire, MD  HPI: Patient is a 68 year old male now about 10 months having completed both external beam radiation therapy to prostate and pelvic nodes plus an I-125 interstitial implant for boost for Gleason 8 (4+4) adenocarcinoma.  He is currently on androgen deprivation therapy.  He specifically denies increased lower urinary tract symptoms diarrhea or fatigue.  He is somewhat constipated secondary to recent calcium prescription.  His most recent PSA is [id number].  COMPLICATIONS OF TREATMENT: none  FOLLOW UP COMPLIANCE: keeps appointments   PHYSICAL EXAM:  BP (!) 148/87 (BP Location: Left Arm, Patient Position: Sitting)   Pulse 63   Temp (!) 95.4 F (35.2 C) (Tympanic)   Resp 18   Wt 212 lb 1.6 oz (96.2 kg)   BMI 28.77 kg/m  Well-developed well-nourished patient in NAD. HEENT reveals PERLA, EOMI, discs not visualized.  Oral cavity is clear. No oral mucosal lesions are identified. Neck is clear without evidence of cervical or supraclavicular adenopathy. Lungs are clear to A&P. Cardiac examination is essentially unremarkable with regular rate and rhythm without murmur rub or thrill. Abdomen is benign with no organomegaly or masses noted. Motor sensory and DTR levels are equal and symmetric in the upper and lower extremities. Cranial nerves II through XII are grossly intact. Proprioception is intact. No peripheral adenopathy or edema is identified. No motor or sensory levels are noted. Crude visual fields are within normal range.  RADIOLOGY RESULTS: No current films for review  PLAN:  Present time patient is under excellent biochemical control of his prostate cancer.  He continues close follow-up care with urology.  I have asked to see him back in 1 year for follow-up with a PSA.  Patient is to call sooner with any concerns.  I would like to take this opportunity to thank you for allowing me to participate in the care of your patient.Noreene Filbert, MD

## 2019-07-21 ENCOUNTER — Ambulatory Visit: Payer: Medicare Other | Admitting: Urology

## 2019-08-11 ENCOUNTER — Encounter: Payer: Self-pay | Admitting: Physician Assistant

## 2019-08-11 ENCOUNTER — Ambulatory Visit: Payer: Medicare Other | Admitting: Physician Assistant

## 2019-08-11 ENCOUNTER — Other Ambulatory Visit: Payer: Self-pay

## 2019-08-11 VITALS — BP 127/78 | HR 65 | Ht 72.0 in | Wt 212.0 lb

## 2019-08-11 DIAGNOSIS — C61 Malignant neoplasm of prostate: Secondary | ICD-10-CM

## 2019-08-11 DIAGNOSIS — N492 Inflammatory disorders of scrotum: Secondary | ICD-10-CM

## 2019-08-11 MED ORDER — SULFAMETHOXAZOLE-TRIMETHOPRIM 800-160 MG PO TABS: 1.0000 | ORAL_TABLET | Freq: Two times a day (BID) | ORAL | 0 refills | Status: DC

## 2019-08-11 MED ORDER — LEUPROLIDE ACETATE (6 MONTH) 45 MG ~~LOC~~ KIT
45.0000 mg | PACK | Freq: Once | SUBCUTANEOUS | Status: AC
  Administered 2019-08-11: 10:00:00 45 mg via SUBCUTANEOUS

## 2019-08-11 NOTE — Progress Notes (Signed)
08/11/2019 10:03 AM   Roy Gentry Roy Gentry Roy Gentry, Roy Gentry Roy Gentry  CC: ADT follow-up, scrotal bump  HPI: Roy Gentry is a 69 y.o. male who presents today for 40-month follow-up on ADT for management of T2b high risk prostate cancer.  He is due for an Eligard injection today.  Additionally, he notes a bump on his left groin that he would like evaluated today.  Overall, patient reports doing well on ADT.  He reports some lethargy and hot flashes, otherwise no acute concerns.  He continues to take daily calcium and vitamin D supplements as directed.  Additionally, he notes a "bump" of his left groin that has been in place for approximately 3 months.  He states it looked like a pimple when it first started, however it has become progressively larger and is sometimes associated with increased redness and tenderness.  He attempted to squeeze the bump approximately 1 month ago with return of blood.  He denies fever, nausea, and vomiting.  PMH: Past Medical History:  Diagnosis Date  . Asthma   . Cancer ([place])   . Family history of adverse reaction to anesthesia    ponv    Surgical History: Past Surgical History:  Procedure Laterality Date  . heel fracture Bilateral   . RADIOACTIVE SEED IMPLANT N/A 03/09/2018   Procedure: RADIOACTIVE SEED IMPLANT/BRACHYTHERAPY IMPLANT;  Surgeon: Abbie Sons, MD;  Location: [facility] ORS;  Service: Urology;  Laterality: N/A;  . TONSILECTOMY, ADENOIDECTOMY, BILATERAL MYRINGOTOMY AND TUBES      Gentry Medications:  Allergies as of 08/11/2019      Reactions   Simvastatin Other (See Comments)   Joint pain   Donnatal [pb-hyoscy-atropine-scopolamine] Rash      Medication List       Accurate as of August 11, 2019 10:03 AM. If you have any questions, ask your nurse or doctor.        STOP taking these medications   sildenafil 100 MG tablet Commonly known as: VIAGRA Stopped by: Debroah Loop, PA-C   tamsulosin 0.4 MG Caps capsule Commonly known  as: FLOMAX Stopped by: Debroah Loop, PA-C     TAKE these medications   Aleve PM 220-25 MG Tabs Generic drug: Naproxen Sod-diphenhydrAMINE Take 1 tablet by mouth at bedtime as needed (sleep).   calcium-vitamin D 500-200 MG-UNIT tablet Commonly known as: OSCAL WITH D Take 1 tablet by mouth.   diphenhydrAMINE 25 MG tablet Commonly known as: BENADRYL Take 25 mg by mouth daily as needed for allergies.   Melatonin 5 MG Chew Chew 5 mg by mouth at bedtime as needed (sleep).       Allergies:  Allergies  Allergen Reactions  . Simvastatin Other (See Comments)    Joint pain  . Donnatal [Pb-Hyoscy-Atropine-Scopolamine] Rash    Family History: Family History  Problem Relation Age of Onset  . Prostate cancer Neg Hx   . Bladder Cancer Neg Hx   . Kidney cancer Neg Hx     Social History:   reports that he has never smoked. He has never used smokeless tobacco. He reports current alcohol use of about 8.0 standard drinks of alcohol per week. He reports previous drug use.  Physical Exam: BP 127/78   Pulse 65   Ht 6' (1.829 m)   Wt 212 lb (96.2 kg)   BMI 28.75 kg/m   Constitutional:  Alert and oriented, no acute distress, nontoxic appearing HEENT: [place], AT Cardiovascular: No clubbing, cyanosis, or edema Respiratory: Normal respiratory effort, no increased work of  breathing GU: Localized area of erythema and mild edema of the anterior left scrotum with a fluctuant middle measuring approximately 2 cm in diameter.  No purulence or crepitus noted. Skin: No rashes, bruises or suspicious lesions Neurologic: Grossly intact, no focal deficits, moving all 4 extremities Psychiatric: Normal mood and affect  Assessment & Plan:   1. Prostate cancer ([place]) Tolerating ADT well.  Counseled patient that fatigue and night sweats are common side effects of therapy.  Counseled him to continue calcium and vitamin D supplementation.  He expressed understanding.  We will repeat PSA today.   Eligard administered today, see separate procedure note for details - PSA - leuprolide (6 Month) (ELIGARD) injection 45 mg  2. Scrotal abscess Small left scrotal abscess noted on physical exam today, denies systemic symptoms of infection.  We will proceed with a trial of Bactrim DS twice daily x14 days for management of this with recheck in 2 weeks.  If the abscess has failed to resolve, will proceed with I&D in clinic. - sulfamethoxazole-trimethoprim (BACTRIM DS) 800-160 MG tablet; Take 1 tablet by mouth 2 (two) times daily for 14 days.  Dispense: 28 tablet; Refill: 0   Return in about 2 weeks (around 08/25/2019) for Abscess recheck with possible I&D.  Debroah Loop, PA-C  [facility] [address] [place], [place] [address] [phone]

## 2019-08-11 NOTE — Progress Notes (Signed)
Eligard SubQ Injection   Due to Prostate Cancer patient is present today for a Eligard Injection.  Medication: Eligard 6 month Dose: 45 mg  Location: right lower abdominal tissue Lot: [id number] Exp: 03/2021  Patient tolerated well, no complications were noted  Performed by: Verlene Mayer, CMA  Per Dr. Bernardo Heater patient is to continue therapy for 6 months . Patient's next follow up was scheduled. This appointment was scheduled using wheel and given to patient today along with reminder continue on Vitamin D 800-1000iu and Calium 1000-1200mg  daily while on Androgen Deprivation Therapy.

## 2019-08-11 NOTE — Patient Instructions (Signed)
Continue to take your Calcium (1000-1200mg ) and Vitamin [phone] IU) supplements daily.

## 2019-08-12 ENCOUNTER — Telehealth: Payer: Self-pay | Admitting: Physician Assistant

## 2019-08-12 LAB — PSA: Prostate Specific Ag, Serum: <0.1 ng/mL (ref 0.0–4.0)

## 2019-08-12 NOTE — Telephone Encounter (Signed)
Please contact the patient and inform him that his PSA is undetectable. His treatment is working well.

## 2019-08-12 NOTE — Telephone Encounter (Signed)
Notified patient as instructed, patient pleased. Discussed follow-up appointments, patient agrees  

## 2019-08-25 ENCOUNTER — Ambulatory Visit (INDEPENDENT_AMBULATORY_CARE_PROVIDER_SITE_OTHER): Payer: Medicare Other | Admitting: Physician Assistant

## 2019-08-25 ENCOUNTER — Other Ambulatory Visit: Payer: Self-pay

## 2019-08-25 VITALS — BP 128/86 | HR 74 | Ht 72.0 in | Wt 215.0 lb

## 2019-08-25 DIAGNOSIS — N492 Inflammatory disorders of scrotum: Secondary | ICD-10-CM

## 2019-08-25 MED ORDER — SULFAMETHOXAZOLE-TRIMETHOPRIM 800-160 MG PO TABS: 1.0000 | ORAL_TABLET | Freq: Two times a day (BID) | ORAL | 0 refills | Status: AC

## 2019-08-25 NOTE — Patient Instructions (Signed)
Incision and Drainage, Care After This sheet gives you information about how to care for yourself after your procedure. Your health care provider may also give you more specific instructions. If you have problems or questions, contact your health care provider. What can I expect after the procedure? After the procedure, it is common to have:  Pain or discomfort around the incision site.  Blood, fluid, or pus (drainage) from the incision.  Redness and firm skin around the incision site. Follow these instructions at home: Medicines  Take over-the-counter and prescription medicines only as told by your health care provider.  If you were prescribed an antibiotic medicine, use or take it as told by your health care provider. Do not stop using the antibiotic even if you start to feel better. Wound care Follow instructions from your health care provider about how to take care of your wound. Make sure you:  Wash your hands with soap and water before and after you change your bandage (dressing). If soap and water are not available, use hand sanitizer.  Change your dressing and packing as told by your health care provider. ? If your dressing is dry or stuck when you try to remove it, moisten or wet the dressing with saline or water so that it can be removed without harming your skin or tissues. ? If your wound is packed, leave it in place until your health care provider tells you to remove it. To remove the packing, moisten or wet the packing with saline or water so that it can be removed without harming your skin or tissues.  Leave stitches (sutures), skin glue, or adhesive strips in place. These skin closures may need to stay in place for 2 weeks or longer. If adhesive strip edges start to loosen and curl up, you may trim the loose edges. Do not remove adhesive strips completely unless your health care provider tells you to do that. Check your wound every day for signs of infection. Check  for:  More redness, swelling, or pain.  More fluid or blood.  Warmth.  Pus or a bad smell. If you were sent home with a drain tube in place, follow instructions from your health care provider about:  How to empty it.  How to care for it at home.  General instructions  Rest the affected area.  Do not take baths, swim, or use a hot tub until your health care provider approves. Ask your health care provider if you may take showers. You may only be allowed to take sponge baths.  Return to your normal activities as told by your health care provider. Ask your health care provider what activities are safe for you. Your health care provider may put you on activity or lifting restrictions.  The incision will continue to drain. It is normal to have some clear or slightly bloody drainage. The amount of drainage should lessen each day.  Do not apply any creams, ointments, or liquids unless you have been told to by your health care provider.  Keep all follow-up visits as told by your health care provider. This is important. Contact a health care provider if:  Your cyst or abscess returns.  You have a fever or chills.  You have more redness, swelling, or pain around your incision.  You have more fluid or blood coming from your incision.  Your incision feels warm to the touch.  You have pus or a bad smell coming from your incision.  You have red streaks   above or below the incision site. Get help right away if:  You have severe pain or bleeding.  You cannot eat or drink without vomiting.  You have decreased urine output.  You become short of breath.  You have chest pain.  You cough up blood.  The affected area becomes numb or starts to tingle. These symptoms may represent a serious problem that is an emergency. Do not wait to see if the symptoms will go away. Get medical help right away. Call your local emergency services (911 in the [place] Do not drive yourself to the  hospital. Summary  After this procedure, it is common to have fluid, blood, or pus coming from the surgery site.  Follow all home care instructions. You will be told how to take care of your incision, how to check for infection, and how to take medicines.  If you were prescribed an antibiotic medicine, take it as told by your health care provider. Do not stop taking the antibiotic even if you start to feel better.  Contact a health care provider if you have increased redness, swelling, or pain around your incision. Get help right away if you have chest pain, you vomit, you cough up blood, or you have shortness of breath.  Keep all follow-up visits as told by your health care provider. This is important. This information is not intended to replace advice given to you by your health care provider. Make sure you discuss any questions you have with your health care provider. Document Revised: 04/12/2018 Document Reviewed: 04/12/2018 Elsevier Patient Education  2020 Elsevier Inc.   

## 2019-08-25 NOTE — Progress Notes (Signed)
08/25/2019 4:43 PM   Roy Gentry Roy Gentry [birth year] [id number]  CC: Follow-up scrotal abscess  HPI: Roy Gentry is a 69 y.o. male who presents today for follow-up of a small left scrotal abscess following 14 days of Bactrim DS twice daily.  Patient reports that the abscess is less inflamed following antibiotics, however he has been able to express some fluid from it over the past several days.  He denies fever, chills, nausea, and vomiting.    PMH: Past Medical History:  Diagnosis Date  . Asthma   . Cancer ([place])   . Family history of adverse reaction to anesthesia    ponv    Surgical History: Past Surgical History:  Procedure Laterality Date  . heel fracture Bilateral   . RADIOACTIVE SEED IMPLANT N/A 03/09/2018   Procedure: RADIOACTIVE SEED IMPLANT/BRACHYTHERAPY IMPLANT;  Surgeon: Abbie Sons, MD;  Location: [facility] ORS;  Service: Urology;  Laterality: N/A;  . TONSILECTOMY, ADENOIDECTOMY, BILATERAL MYRINGOTOMY AND TUBES      Gentry Medications:  Allergies as of 08/25/2019      Reactions   Simvastatin Other (See Comments)   Joint pain   Donnatal [pb-hyoscy-atropine-scopolamine] Rash      Medication List       Accurate as of August 25, 2019  4:43 PM. If you have any questions, ask your nurse or doctor.        Aleve PM 220-25 MG Tabs Generic drug: Naproxen Sod-diphenhydrAMINE Take 1 tablet by mouth at bedtime as needed (sleep).   calcium-vitamin D 500-200 MG-UNIT tablet Commonly known as: OSCAL WITH D Take 1 tablet by mouth.   diphenhydrAMINE 25 MG tablet Commonly known as: BENADRYL Take 25 mg by mouth daily as needed for allergies.   Melatonin 5 MG Chew Chew 5 mg by mouth at bedtime as needed (sleep).   sulfamethoxazole-trimethoprim 800-160 MG tablet Commonly known as: BACTRIM DS Take 1 tablet by mouth 2 (two) times daily for 7 days.       Allergies:  Allergies  Allergen Reactions  . Simvastatin Other (See Comments)    Joint pain  . Donnatal  [Pb-Hyoscy-Atropine-Scopolamine] Rash    Family History: Family History  Problem Relation Age of Onset  . Prostate cancer Neg Hx   . Bladder Cancer Neg Hx   . Kidney cancer Neg Hx     Social History:   reports that he has never smoked. He has never used smokeless tobacco. He reports current alcohol use of about 8.0 standard drinks of alcohol per week. He reports previous drug use.  Physical Exam: BP 128/86   Pulse 74   Ht 6' (1.829 m)   Wt 215 lb (97.5 kg)   BMI 29.16 kg/m   Constitutional:  Alert and oriented, no acute distress, nontoxic appearing HEENT: [place], AT Cardiovascular: No clubbing, cyanosis, or edema Respiratory: Normal respiratory effort, no increased work of breathing GU: Site of left scrotal abscess noted with resolved erythema compared to prior exam.  There remains a small, approximate 1 x 1 cm area of fluctuance at the site with overlying discoloration and no apparent drainage. Skin: No rashes, bruises or suspicious lesions Neurologic: Grossly intact, no focal deficits, moving all 4 extremities Psychiatric: Normal mood and affect  Assessment & Plan:   1. Scrotal abscess 69 year old male presents today for reevaluation of a small left scrotal abscess after 2 weeks of twice daily Bactrim DS.  Erythema and tenderness have resolved on antibiotics, however the abscess has persisted.  I recommended I&D in  office for management of this with concern that it would continue to intermittently flare if left untreated.  Patient agreed.  I cleaned and prepped the patient in a sterile fashion using Betadine swabs and established a field block using 1% lidocaine in a diamond-shaped pattern around the abscess.  Patient noted the absence of sensation following placement of the field block.  I used a 15 blade scalpel to create a stab incision overlying the area of fluctuance approximately 0.33cm deep with immediate return of a small volume of dark red, thick output.  I inserted a sterile  Q-tip into the incision and worked in a circular fashion to break up any loculations.  I then expressed the wound further until I obtained return of cherry red bloody output.  Wound too small to pack or irrigate.  I reapplied Betadine around the incision, covered it with sterile gauze, and fixed in place with paper tape.  Counseled the patient to change the gauze daily and proceed with his normal bathing routine.  Extended Bactrim DS twice daily for 7 more days for infection prevention.  Patient tolerated well, no complications noted. - sulfamethoxazole-trimethoprim (BACTRIM DS) 800-160 MG tablet; Take 1 tablet by mouth 2 (two) times daily for 7 days.  Dispense: 14 tablet; Refill: 0  Return if symptoms worsen or fail to improve.  Debroah Loop, PA-C  [facility] [address], [place], [place] [address] [phone]

## 2019-11-25 ENCOUNTER — Telehealth: Payer: Self-pay

## 2019-11-25 NOTE — Telephone Encounter (Signed)
Form faxed for coverage determination of Eligard/Lupron.

## 2019-11-30 NOTE — Telephone Encounter (Signed)
Incoming fax with approval for Eligard, NO PA required.

## 2020-01-16 ENCOUNTER — Inpatient Hospital Stay: Payer: Medicare Other | Attending: Radiation Oncology

## 2020-01-16 ENCOUNTER — Other Ambulatory Visit: Payer: Self-pay

## 2020-01-16 DIAGNOSIS — C61 Malignant neoplasm of prostate: Secondary | ICD-10-CM | POA: Insufficient documentation

## 2020-01-17 LAB — PSA: Prostatic Specific Antigen: <0.01 ng/mL (ref 0.00–4.00)

## 2020-01-18 ENCOUNTER — Telehealth: Payer: Self-pay | Admitting: *Deleted

## 2020-01-18 NOTE — Telephone Encounter (Signed)
Lab from [facility] called with a corrected report of patient PSA originally reported as 0, but should read <0.01

## 2020-01-20 ENCOUNTER — Other Ambulatory Visit: Payer: Self-pay

## 2020-01-20 ENCOUNTER — Encounter: Payer: Self-pay | Admitting: Radiation Oncology

## 2020-01-23 ENCOUNTER — Other Ambulatory Visit: Payer: Self-pay

## 2020-01-23 ENCOUNTER — Encounter: Payer: Self-pay | Admitting: Radiation Oncology

## 2020-01-23 ENCOUNTER — Ambulatory Visit
Admission: RE | Admit: 2020-01-23 | Discharge: 2020-01-23 | Disposition: A | Discharge status: Home / Self Care | Payer: Medicare Other | Source: Ambulatory Visit | Attending: Radiation Oncology | Admitting: Radiation Oncology

## 2020-01-23 ENCOUNTER — Encounter (INDEPENDENT_AMBULATORY_CARE_PROVIDER_SITE_OTHER): Payer: Self-pay

## 2020-01-23 VITALS — BP 142/84 | HR 68 | Temp 98.2°F | Resp 16 | Wt 219.8 lb

## 2020-01-23 DIAGNOSIS — C61 Malignant neoplasm of prostate: Secondary | ICD-10-CM

## 2020-01-23 NOTE — Progress Notes (Signed)
Radiation Oncology Follow up Note  Name: Roy Gentry   Date:   01/23/2020 MRN:  [id number] DOB: March 23, 1951    This 69 y.o. male presents to the clinic today for 2-year follow-up status post I-125 interstitial implant for boost as well as prostate and pelvic external beam radiation therapy for Gleason 8 (4+4) adenocarcinoma.Marland Kitchen  REFERRING PROVIDER: Baxter Hire, MD  HPI: Patient is a 69 year old male now out 2 years having completed both external beam radiation therapy to his prostate and pelvic nodes plus an I-125 interstitial implant for boost for Gleason 8 adenocarcinoma the prostate.  He is seen today in routine follow-up and is doing well specifically denies any increased lower urinary tract symptoms diarrhea or fatigue.Marland Kitchen  His PSA performed 7 days ago continues to be less than 0.01 patient is no longer on ADT therapy  COMPLICATIONS OF TREATMENT: none  FOLLOW UP COMPLIANCE: keeps appointments   PHYSICAL EXAM:  BP (!) 142/84 (BP Location: Right Arm, Patient Position: Sitting, Cuff Size: Normal)   Pulse 68   Temp 98.2 F (36.8 C) (Tympanic)   Resp 16   Wt 219 lb 12.8 oz (99.7 kg)   BMI 29.81 kg/m  Well-developed well-nourished patient in NAD. HEENT reveals PERLA, EOMI, discs not visualized.  Oral cavity is clear. No oral mucosal lesions are identified. Neck is clear without evidence of cervical or supraclavicular adenopathy. Lungs are clear to A&P. Cardiac examination is essentially unremarkable with regular rate and rhythm without murmur rub or thrill. Abdomen is benign with no organomegaly or masses noted. Motor sensory and DTR levels are equal and symmetric in the upper and lower extremities. Cranial nerves II through XII are grossly intact. Proprioception is intact. No peripheral adenopathy or edema is identified. No motor or sensory levels are noted. Crude visual fields are within normal range.  RADIOLOGY RESULTS: No current films to review  PLAN: Present time patient is under  excellent biochemical control of his prostate cancer and pleased with his overall progress.  I have asked to see the patient back in 1 year for follow-up with a PSA preceding the visit.  Patient knows to call with any concerns at any time.  I would like to take this opportunity to thank you for allowing me to participate in the care of your patient.Noreene Filbert, MD

## 2020-01-24 ENCOUNTER — Other Ambulatory Visit: Payer: Self-pay | Admitting: Unknown Physician Specialty

## 2020-01-26 LAB — SURGICAL PATHOLOGY

## 2020-02-15 ENCOUNTER — Encounter: Payer: Self-pay | Admitting: Urology

## 2020-02-15 ENCOUNTER — Ambulatory Visit: Payer: Medicare Other | Admitting: Urology

## 2020-02-15 ENCOUNTER — Other Ambulatory Visit: Payer: Self-pay

## 2020-02-15 VITALS — BP 139/80 | HR 68 | Ht 72.0 in | Wt 222.0 lb

## 2020-02-15 DIAGNOSIS — C61 Malignant neoplasm of prostate: Secondary | ICD-10-CM | POA: Diagnosis not present

## 2020-02-15 NOTE — Progress Notes (Signed)
02/15/2020 10:44 AM   Roy Gentry 1950-11-21 [id number]  Referring provider: Baxter Hire, MD [place],  [place] [address]  Chief Complaint  Patient presents with  . 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 male presents for follow-up of prostate cancer   Saw Dr. Baruch Gouty last month; PSA <0.01  No bothersome LUTS  Denies dysuria/gross hematuria  Denies flank, abdominal or pelvic pain  Has completed 2 years ADT  Radiation follow-up scheduled 1 year   PMH: Past Medical History:  Diagnosis Date  . Asthma   . Cancer ([place])   . Family history of adverse reaction to anesthesia    ponv    Surgical History: Past Surgical History:  Procedure Laterality Date  . heel fracture Bilateral   . RADIOACTIVE SEED IMPLANT N/A 03/09/2018   Procedure: RADIOACTIVE SEED IMPLANT/BRACHYTHERAPY IMPLANT;  Surgeon: Abbie Sons, MD;  Location: [facility] ORS;  Service: Urology;  Laterality: N/A;  . TONSILECTOMY, ADENOIDECTOMY, BILATERAL MYRINGOTOMY AND TUBES      Home Medications:  Allergies as of 02/15/2020      Reactions   Simvastatin Other (See Comments)   Joint pain   Donnatal [pb-hyoscy-atropine-scopolamine] Rash      Medication List       Accurate as of February 15, 2020 10:44 AM. If you have any questions, ask your nurse or doctor.        Aleve PM 220-25 MG Tabs Generic drug: Naproxen Sod-diphenhydrAMINE Take 1 tablet by mouth at bedtime as needed (sleep).   calcium-vitamin D 500-200 MG-UNIT tablet Commonly known as: OSCAL WITH D Take 1 tablet by mouth.   diphenhydrAMINE 25 MG tablet Commonly known as: BENADRYL Take 25 mg by mouth daily as needed for allergies.   Melatonin 5 MG Chew Chew 5 mg by mouth at bedtime as needed (sleep).   mometasone 0.1 %  lotion Commonly known as: ELOCON SMARTSIG:In Ear(s) Twice Daily PRN   Red Yeast Rice 600 MG Tabs Take 2 tablets by mouth daily.       Allergies:  Allergies  Allergen Reactions  . Simvastatin Other (See Comments)    Joint pain  . Donnatal [Pb-Hyoscy-Atropine-Scopolamine] Rash    Family History: Family History  Problem Relation Age of Onset  . Prostate cancer Neg Hx   . Bladder Cancer Neg Hx   . Kidney cancer Neg Hx     Social History:  reports that he has never smoked. He has never used smokeless tobacco. He reports current alcohol use of about 8.0 standard drinks of alcohol per week. He reports previous drug use.   Physical Exam: BP 139/80   Pulse 68   Ht 6' (1.829 m)   Wt 222 lb (100.7 kg)   BMI 30.11 kg/m   Constitutional:  Alert and oriented, No acute distress. HEENT: [place] AT, moist mucus membranes.  Trachea midline, no masses. Cardiovascular: No clubbing, cyanosis, or edema. Respiratory: Normal respiratory effort, no increased work of breathing. Skin: No rashes, bruises or suspicious lesions. Neurologic: Grossly intact, no focal deficits, moving all 4 extremities. Psychiatric: Normal mood and affect.   Assessment & Plan:    1.  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

## 2020-02-26 DIAGNOSIS — C61 Malignant neoplasm of prostate: Secondary | ICD-10-CM | POA: Insufficient documentation

## 2020-08-16 ENCOUNTER — Other Ambulatory Visit: Payer: Self-pay

## 2020-08-16 ENCOUNTER — Ambulatory Visit: Payer: Medicare Other | Admitting: Urology

## 2020-08-16 ENCOUNTER — Encounter: Payer: Self-pay | Admitting: Urology

## 2020-08-16 VITALS — BP 137/80 | HR 63 | Ht 72.0 in | Wt 222.0 lb

## 2020-08-16 DIAGNOSIS — C61 Malignant neoplasm of prostate: Secondary | ICD-10-CM

## 2020-08-16 NOTE — Progress Notes (Signed)
08/16/2020 10:36 AM   Roy Gentry Apr 29, 1951 [id number]  Referring provider: Baxter Hire, MD [place],  [place] [address]  Chief Complaint  Patient presents with   Prostate Cancer    Urologic history: 1.T2b high risk prostate cancer -Biopsy 10/2017; PSA 1.5; right apical nodule with induration -Volume 44 g; Gleason 4+/4+5 adenocarcinoma -IMRT + brachytherapy + ADT (24 months) -Brachytherapy 02/2018   HPI: 70 y.o. male presents for follow-up of prostate cancer.   Still complaining of hot flashes; intensity/severity has decreased but frequency remains the same  Final leuprolide injection March 2021  No bothersome LUTS  Denies dysuria, gross hematuria  Denies flank, abdominal or pelvic pain  Last PSA was in radiation oncology 12/2019 and was <0.01   PMH: Past Medical History:  Diagnosis Date   Asthma    Cancer ([place])    Family history of adverse reaction to anesthesia    ponv    Surgical History: Past Surgical History:  Procedure Laterality Date   heel fracture Bilateral    RADIOACTIVE SEED IMPLANT N/A 03/09/2018   Procedure: RADIOACTIVE SEED IMPLANT/BRACHYTHERAPY IMPLANT;  Surgeon: Abbie Sons, MD;  Location: [facility] ORS;  Service: Urology;  Laterality: N/A;   TONSILECTOMY, ADENOIDECTOMY, BILATERAL MYRINGOTOMY AND TUBES      Home Medications:  Allergies as of 08/16/2020      Reactions   Simvastatin Other (See Comments)   Joint pain   Donnatal [pb-hyoscy-atropine-scopolamine] Rash      Medication List       Accurate as of August 16, 2020 10:36 AM. If you have any questions, ask your nurse or doctor.        calcium-vitamin D 500-200 MG-UNIT tablet Commonly known as: OSCAL WITH D Take 1 tablet by mouth.   diphenhydrAMINE 25 MG tablet Commonly known as: BENADRYL Take 25 mg by mouth daily as needed for allergies.   Melatonin 5 MG Chew Chew 5 mg by mouth at bedtime as needed (sleep).   mometasone 0.1 %  lotion Commonly known as: ELOCON SMARTSIG:In Ear(s) Twice Daily PRN   Naproxen Sod-diphenhydrAMINE 220-25 MG Tabs Take 1 tablet by mouth at bedtime as needed (sleep).   Red Yeast Rice 600 MG Tabs Take 2 tablets by mouth daily.   rosuvastatin 10 MG tablet Commonly known as: CRESTOR Take 10 mg by mouth daily.       Allergies:  Allergies  Allergen Reactions   Simvastatin Other (See Comments)    Joint pain   Donnatal [Pb-Hyoscy-Atropine-Scopolamine] Rash    Family History: Family History  Problem Relation Age of Onset   Prostate cancer Neg Hx    Bladder Cancer Neg Hx    Kidney cancer Neg Hx     Social History:  reports that he has never smoked. He has never used smokeless tobacco. He reports current alcohol use of about 8.0 standard drinks of alcohol per week. He reports previous drug use.   Physical Exam: BP 137/80    Pulse 63    Ht 6' (1.829 m)    Wt 222 lb (100.7 kg)    BMI 30.11 kg/m   Constitutional:  Alert and oriented, No acute distress. HEENT: [place] AT, moist mucus membranes.  Trachea midline, no masses. Cardiovascular: No clubbing, cyanosis, or edema. Respiratory: Normal respiratory effort, no increased work of breathing. Skin: No rashes, bruises or suspicious lesions. Neurologic: Grossly intact, no focal deficits, moving all 4 extremities. Psychiatric: Normal mood and affect.   Assessment & Plan:  1.  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

## 2020-08-17 ENCOUNTER — Encounter: Payer: Self-pay | Admitting: Urology

## 2020-08-17 LAB — TESTOSTERONE: Testosterone: 19 ng/dL — ABNORMAL LOW (ref 264–916)

## 2020-08-17 LAB — PSA: Prostate Specific Ag, Serum: <0.1 ng/mL (ref 0.0–4.0)

## 2020-08-20 ENCOUNTER — Telehealth: Payer: Self-pay | Admitting: *Deleted

## 2020-08-20 NOTE — Telephone Encounter (Signed)
Notified patient as instructed, patient pleased. Discussed follow-up appointments, patient agrees  

## 2020-08-20 NOTE — Telephone Encounter (Signed)
-----   Message from Abbie Sons, MD sent at 08/17/2020  2:25 PM EDT ----- PSA remains undetectable at <0.1.  Testosterone level is very low at 19 which is the reason he is still having hot flashes.  Recommend lab visit for repeat testosterone level in 3 months to see if this is improving.

## 2020-09-11 ENCOUNTER — Other Ambulatory Visit: Payer: Self-pay | Admitting: Family Medicine

## 2020-09-11 DIAGNOSIS — R7989 Other specified abnormal findings of blood chemistry: Secondary | ICD-10-CM

## 2020-11-20 ENCOUNTER — Other Ambulatory Visit: Payer: Self-pay

## 2020-11-20 ENCOUNTER — Other Ambulatory Visit: Payer: Medicare Other

## 2020-11-20 DIAGNOSIS — R7989 Other specified abnormal findings of blood chemistry: Secondary | ICD-10-CM

## 2020-11-21 LAB — TESTOSTERONE: Testosterone: 26 ng/dL — ABNORMAL LOW (ref 264–916)

## 2020-11-22 ENCOUNTER — Telehealth: Payer: Self-pay | Admitting: *Deleted

## 2020-11-22 NOTE — Telephone Encounter (Signed)
Notified patient as instructed, patient pleased °

## 2020-11-22 NOTE — Telephone Encounter (Signed)
-----   Message from Abbie Sons, MD sent at 11/22/2020  1:34 PM EDT ----- Testosterone level has still not recovered from the androgen deprivation therapy and was 26.  He will be 3 years out from his treatment in October 2022.  Recommend follow-up appointment with testosterone level in October and will discuss pros and cons of testosterone replacement at that visit if PSA is stable

## 2020-11-29 ENCOUNTER — Other Ambulatory Visit: Payer: Self-pay | Admitting: *Deleted

## 2021-01-15 ENCOUNTER — Other Ambulatory Visit: Payer: Self-pay

## 2021-01-15 ENCOUNTER — Ambulatory Visit: Payer: Medicare Other | Admitting: Radiation Oncology

## 2021-01-15 ENCOUNTER — Inpatient Hospital Stay: Payer: Medicare Other | Attending: Radiation Oncology

## 2021-01-15 DIAGNOSIS — C61 Malignant neoplasm of prostate: Secondary | ICD-10-CM | POA: Diagnosis not present

## 2021-01-15 LAB — PSA: Prostatic Specific Antigen: <0.01 ng/mL (ref 0.00–4.00)

## 2021-01-21 ENCOUNTER — Encounter: Payer: Self-pay | Admitting: Radiation Oncology

## 2021-01-21 ENCOUNTER — Ambulatory Visit
Admission: RE | Admit: 2021-01-21 | Discharge: 2021-01-21 | Disposition: A | Discharge status: Home / Self Care | Payer: Medicare Other | Source: Ambulatory Visit | Attending: Radiation Oncology | Admitting: Radiation Oncology

## 2021-01-21 DIAGNOSIS — C61 Malignant neoplasm of prostate: Secondary | ICD-10-CM | POA: Insufficient documentation

## 2021-01-21 DIAGNOSIS — Z923 Personal history of irradiation: Secondary | ICD-10-CM | POA: Diagnosis not present

## 2021-01-21 NOTE — Progress Notes (Signed)
Radiation Oncology Follow up Note  Name: Roy Gentry   Date:   01/21/2021 MRN:  [id number] DOB: [birth year]    This 70 y.o. male presents to the clinic today for 3-year follow-up status post I-125 interstitial implant for boost as well as prostate and pelvic external beam radiation therapy for Gleason 8 (4+4) adenocarcinoma.Marland Kitchen  REFERRING PROVIDER: Baxter Hire, MD  HPI: Patient is a 70 year old male now out 3 years status post I-125 interstitial implant plus external beam radiation therapy to his prostate and pelvic nodes for Gleason 8 adenocarcinoma the prostate seen today in routine follow-up he is doing well.  He specifically denies any increased lower urinary tract symptoms diarrhea or fatigue.  His PSA remains undetectable at less than 0.01.Marland Kitchen  COMPLICATIONS OF TREATMENT: none  FOLLOW UP COMPLIANCE: keeps appointments   PHYSICAL EXAM:  BP (P) 133/79 (BP Location: Left Arm, Patient Position: Sitting)   Pulse (P) 61   Temp (!) (P) 95.4 F (35.2 C) (Tympanic)   Resp (P) 18   Wt (P) 217 lb 1.6 oz (98.5 kg)   BMI (P) 29.44 kg/m  Well-developed well-nourished patient in NAD. HEENT reveals PERLA, EOMI, discs not visualized.  Oral cavity is clear. No oral mucosal lesions are identified. Neck is clear without evidence of cervical or supraclavicular adenopathy. Lungs are clear to A&P. Cardiac examination is essentially unremarkable with regular rate and rhythm without murmur rub or thrill. Abdomen is benign with no organomegaly or masses noted. Motor sensory and DTR levels are equal and symmetric in the upper and lower extremities. Cranial nerves II through XII are grossly intact. Proprioception is intact. No peripheral adenopathy or edema is identified. No motor or sensory levels are noted. Crude visual fields are within normal range.  RADIOLOGY RESULTS: No current films for review  PLAN: Present time patient remains under excellent biochemical control of his prostate cancer.  And  pleased with his overall progress.  I have asked to see him back in 1 year for follow-up.  Patient knows to call with any concerns.  I would like to take this opportunity to thank you for allowing me to participate in the care of your patient.Noreene Filbert, MD

## 2021-03-01 ENCOUNTER — Other Ambulatory Visit: Payer: Self-pay

## 2021-03-01 DIAGNOSIS — C61 Malignant neoplasm of prostate: Secondary | ICD-10-CM

## 2021-03-01 DIAGNOSIS — R7989 Other specified abnormal findings of blood chemistry: Secondary | ICD-10-CM

## 2021-03-11 ENCOUNTER — Other Ambulatory Visit: Payer: Self-pay

## 2021-03-11 ENCOUNTER — Other Ambulatory Visit: Payer: Medicare Other

## 2021-03-11 DIAGNOSIS — R7989 Other specified abnormal findings of blood chemistry: Secondary | ICD-10-CM

## 2021-03-11 DIAGNOSIS — C61 Malignant neoplasm of prostate: Secondary | ICD-10-CM

## 2021-03-12 LAB — PSA: Prostate Specific Ag, Serum: <0.1 ng/mL (ref 0.0–4.0)

## 2021-03-12 LAB — TESTOSTERONE: Testosterone: 57 ng/dL — ABNORMAL LOW (ref 264–916)

## 2021-03-14 ENCOUNTER — Ambulatory Visit: Payer: Self-pay | Admitting: Urology

## 2021-03-20 ENCOUNTER — Ambulatory Visit: Payer: Medicare Other | Admitting: Urology

## 2021-03-20 ENCOUNTER — Encounter: Payer: Self-pay | Admitting: Urology

## 2021-03-20 ENCOUNTER — Other Ambulatory Visit: Payer: Self-pay

## 2021-03-20 VITALS — BP 136/71 | HR 97 | Ht 72.0 in | Wt 215.0 lb

## 2021-03-20 DIAGNOSIS — C61 Malignant neoplasm of prostate: Secondary | ICD-10-CM | POA: Diagnosis not present

## 2021-03-20 DIAGNOSIS — R7989 Other specified abnormal findings of blood chemistry: Secondary | ICD-10-CM

## 2021-03-20 NOTE — Progress Notes (Signed)
03/20/2021 2:01 PM   Roy Gentry June 21, 1950 [id number]  Referring provider: Baxter Hire, MD [place],  [place] [address]  Chief Complaint  Patient presents with   Prostate Cancer    Urologic history: 1.T2b high risk prostate cancer -Biopsy 10/2017; PSA 1.5; right apical nodule with induration -Volume 44 g; Gleason 4+/4+5 adenocarcinoma -IMRT + brachytherapy + ADT (24 months) -Brachytherapy 02/2018  HPI: 70 y.o. male presents for semiannual follow-up.  PSA 03/11/2021 remains undetectable at <0.1 Testosterone level improved but low at 57 Feels hot flashes/sweats have improved and are less severe   PMH: Past Medical History:  Diagnosis Date   Asthma    Cancer ([place])    Family history of adverse reaction to anesthesia    ponv    Surgical History: Past Surgical History:  Procedure Laterality Date   heel fracture Bilateral    RADIOACTIVE SEED IMPLANT N/A 03/09/2018   Procedure: RADIOACTIVE SEED IMPLANT/BRACHYTHERAPY IMPLANT;  Surgeon: Abbie Sons, MD;  Location: [facility] ORS;  Service: Urology;  Laterality: N/A;   TONSILECTOMY, ADENOIDECTOMY, BILATERAL MYRINGOTOMY AND TUBES      Home Medications:  Allergies as of 03/20/2021       Reactions   Simvastatin Other (See Comments)   Joint pain   Donnatal [pb-hyoscy-atropine-scopolamine] Rash        Medication List        Accurate as of March 20, 2021  2:01 PM. If you have any questions, ask your nurse or doctor.          calcium-vitamin D 500-200 MG-UNIT tablet Commonly known as: OSCAL WITH D Take 1 tablet by mouth.   diphenhydrAMINE 25 MG tablet Commonly known as: BENADRYL Take 25 mg by mouth daily as needed for allergies.   Melatonin 5 MG Chew Chew 5 mg by mouth at bedtime as needed (sleep).   mometasone 0.1 % lotion Commonly known as: ELOCON SMARTSIG:In Ear(s) Twice Daily PRN   Naproxen Sod-diphenhydrAMINE 220-25 MG Tabs Take 1 tablet by mouth at bedtime as needed  (sleep).   Red Yeast Rice 600 MG Tabs Take 2 tablets by mouth daily.   rosuvastatin 10 MG tablet Commonly known as: CRESTOR Take 10 mg by mouth daily.        Allergies:  Allergies  Allergen Reactions   Simvastatin Other (See Comments)    Joint pain   Donnatal [Pb-Hyoscy-Atropine-Scopolamine] Rash    Family History: Family History  Problem Relation Age of Onset   Prostate cancer Neg Hx    Bladder Cancer Neg Hx    Kidney cancer Neg Hx     Social History:  reports that he has never smoked. He has never used smokeless tobacco. He reports current alcohol use of about 8.0 standard drinks per week. He reports that he does not currently use drugs.   Physical Exam: BP 136/71   Pulse 97   Ht 6' (1.829 m)   Wt 215 lb (97.5 kg)   BMI 29.16 kg/m   Constitutional:  Alert and oriented, No acute distress. HEENT: [place] AT, moist mucus membranes.  Trachea midline, no masses. Cardiovascular: No clubbing, cyanosis, or edema. Respiratory: Normal respiratory effort, no increased work of breathing.   Assessment & Plan:    T2b high risk prostate cancer PSA remains undetectable Testosterone level slowly improving Follow-up 6 months with testosterone, PSA prior Continue radiation oncology follow-up   Abbie Sons, MD  [place] [address] [place], [place] [address] [phone]  [phone]   

## 2021-08-09 ENCOUNTER — Other Ambulatory Visit: Payer: Self-pay

## 2021-08-09 DIAGNOSIS — C61 Malignant neoplasm of prostate: Secondary | ICD-10-CM

## 2021-08-09 DIAGNOSIS — R7989 Other specified abnormal findings of blood chemistry: Secondary | ICD-10-CM

## 2021-08-13 ENCOUNTER — Other Ambulatory Visit: Payer: Self-pay

## 2021-08-13 ENCOUNTER — Other Ambulatory Visit: Payer: Medicare Other

## 2021-08-13 DIAGNOSIS — C61 Malignant neoplasm of prostate: Secondary | ICD-10-CM

## 2021-08-13 DIAGNOSIS — R7989 Other specified abnormal findings of blood chemistry: Secondary | ICD-10-CM

## 2021-08-14 LAB — PSA: Prostate Specific Ag, Serum: <0.1 ng/mL (ref 0.0–4.0)

## 2021-08-14 LAB — TESTOSTERONE: Testosterone: 95 ng/dL — ABNORMAL LOW (ref 264–916)

## 2021-08-15 ENCOUNTER — Other Ambulatory Visit: Payer: Self-pay

## 2021-08-15 ENCOUNTER — Encounter: Payer: Self-pay | Admitting: Urology

## 2021-08-15 ENCOUNTER — Ambulatory Visit: Payer: Medicare Other | Admitting: Urology

## 2021-08-15 VITALS — BP 130/70 | HR 77 | Ht 72.0 in | Wt 218.0 lb

## 2021-08-15 DIAGNOSIS — R7989 Other specified abnormal findings of blood chemistry: Secondary | ICD-10-CM | POA: Diagnosis not present

## 2021-08-15 DIAGNOSIS — C61 Malignant neoplasm of prostate: Secondary | ICD-10-CM

## 2021-08-15 NOTE — Progress Notes (Signed)
? ?08/15/2021 ?9:33 AM  ? ?Roy Gentry ?April 06, 1951 ?[id number] ? ?Referring provider: Baxter Hire, MD ?[place] ?[place],  [place] [address] ? ?Chief Complaint  ?Patient presents with  ? Prostate Cancer  ? ? ?Urologic history: ?1.T2b high risk prostate cancer ?-Biopsy 10/2017; PSA 1.5; right apical nodule with induration ?-Volume 44 g; Gleason 4+/4+5 adenocarcinoma ?-IMRT + brachytherapy + ADT (24 months) ?-Brachytherapy 02/2018 ? ?HPI: ?71 y.o. male presents for semiannual follow-up. ? ?No complaints since last visit; no bothersome LUTS ?PSA 08/13/2021 remains undetectable at <0.1 ?Testosterone level slightly increased at 95 ?Feels hot flashes/sweats have improved  ? ? ?PMH: ?Past Medical History:  ?Diagnosis Date  ? Asthma   ? Cancer [facility])   ? Family history of adverse reaction to anesthesia   ? ponv  ? ? ?Surgical History: ?Past Surgical History:  ?Procedure Laterality Date  ? heel fracture Bilateral   ? RADIOACTIVE SEED IMPLANT N/A 03/09/2018  ? Procedure: RADIOACTIVE SEED IMPLANT/BRACHYTHERAPY IMPLANT;  Surgeon: Abbie Sons, MD;  Location: [facility] ORS;  Service: Urology;  Laterality: N/A;  ? TONSILECTOMY, ADENOIDECTOMY, BILATERAL MYRINGOTOMY AND TUBES    ? ? ?Home Medications:  ?Allergies as of 08/15/2021   ? ?   Reactions  ? Simvastatin Other (See Comments)  ? Joint pain  ? Donnatal [pb-hyoscy-atropine-scopolamine] Rash  ? ?  ? ?  ?Medication List  ?  ? ?  ? Accurate as of August 15, 2021  9:33 AM. If you have any questions, ask your nurse or doctor.  ?  ?  ? ?  ? ?calcium-vitamin D 500-200 MG-UNIT tablet ?Commonly known as: OSCAL WITH D ?Take 1 tablet by mouth. ?  ?diphenhydrAMINE 25 MG tablet ?Commonly known as: BENADRYL ?Take 25 mg by mouth daily as needed for allergies. ?  ?Melatonin 5 MG Chew ?Chew 5 mg by mouth at bedtime as needed (sleep). ?  ?mometasone 0.1 % lotion ?Commonly known as: ELOCON ?SMARTSIG:In Ear(s) Twice Daily PRN ?  ?Naproxen Sod-diphenhydrAMINE 220-25 MG Tabs ?Take 1 tablet  by mouth at bedtime as needed (sleep). ?  ?Red Yeast Rice 600 MG Tabs ?Take 2 tablets by mouth daily. ?  ?rosuvastatin 10 MG tablet ?Commonly known as: CRESTOR ?Take 10 mg by mouth daily. ?  ? ?  ? ? ?Allergies:  ?Allergies  ?Allergen Reactions  ? Simvastatin Other (See Comments)  ?  Joint pain  ? Donnatal [Pb-Hyoscy-Atropine-Scopolamine] Rash  ? ? ?Family History: ?Family History  ?Problem Relation Age of Onset  ? Prostate cancer Neg Hx   ? Bladder Cancer Neg Hx   ? Kidney cancer Neg Hx   ? ? ?Social History:  reports that he has never smoked. He has never used smokeless tobacco. He reports current alcohol use of about 8.0 standard drinks per week. He reports that he does not currently use drugs. ? ? ?Physical Exam: ?BP 130/70   Pulse 77   Ht 6' (1.829 m)   Wt 218 lb (98.9 kg)   BMI 29.57 kg/m?   ?Constitutional:  Alert and oriented, No acute distress. ?HEENT: [place] AT, moist mucus membranes.  Trachea midline, no masses. ?Cardiovascular: No clubbing, cyanosis, or edema. ?Respiratory: Normal respiratory effort, no increased work of breathing. ? ? ?Assessment & Plan:   ? ?T2b high risk prostate cancer ?PSA remains undetectable ?Testosterone level slowly improving and now above castrate level ?Scheduled for follow-up with Dr. Baruch Gouty August 2023 ?Will see him back in 1 year with PSA/testosterone ?We discussed TRT in previously  treated prostate cancer patients if PSA stable 3-5 years after treatment ? ? ?Abbie Sons, MD ? ?[place] ?[address], [address] ?[place], [place] [address] ?[phone] ? ?

## 2021-08-16 ENCOUNTER — Ambulatory Visit: Payer: Self-pay | Admitting: Urology

## 2022-01-13 ENCOUNTER — Inpatient Hospital Stay: Payer: Medicare Other | Attending: Radiation Oncology

## 2022-01-13 DIAGNOSIS — C61 Malignant neoplasm of prostate: Secondary | ICD-10-CM | POA: Diagnosis present

## 2022-01-13 LAB — PSA: Prostatic Specific Antigen: 0.1 ng/mL (ref 0.00–4.00)

## 2022-01-20 ENCOUNTER — Encounter: Payer: Self-pay | Admitting: Radiation Oncology

## 2022-01-20 ENCOUNTER — Ambulatory Visit
Admission: RE | Admit: 2022-01-20 | Discharge: 2022-01-20 | Disposition: A | Discharge status: Home / Self Care | Payer: Medicare Other | Source: Ambulatory Visit | Attending: Radiation Oncology | Admitting: Radiation Oncology

## 2022-01-20 VITALS — BP 122/73 | HR 58 | Temp 98.3°F | Resp 20 | Wt 219.9 lb

## 2022-01-20 DIAGNOSIS — C61 Malignant neoplasm of prostate: Secondary | ICD-10-CM | POA: Insufficient documentation

## 2022-01-20 DIAGNOSIS — Z923 Personal history of irradiation: Secondary | ICD-10-CM | POA: Diagnosis not present

## 2022-01-20 NOTE — Progress Notes (Signed)
Radiation Oncology Follow up Note  Name: Roy Gentry   Date:   01/20/2022 MRN:  [id number] DOB: 12-Apr-1951    This 71 y.o. male presents to the clinic today for 4-year follow-up status post I-125 interstitial implant for boost as well as prostate and pelvic sternal beam treatment for Gleason 8 (4+4) adenocarcinoma the prostate.  REFERRING PROVIDER: Baxter Hire, MD  HPI: Patient is a 71 year old male now out for years having completed I-125 interstitial implant for boost as well as prostate and pelvic external beam radiation for Gleason 8 adenocarcinoma of the prostate.  Seen today in routine follow-up he is doing well.  He specifically denies any increased lower urinary tract symptoms diarrhea or fatigue.Marland Kitchen  His most recent PSA is 0.1 COMPLICATIONS OF TREATMENT: none  FOLLOW UP COMPLIANCE: keeps appointments   PHYSICAL EXAM:  BP 122/73 (BP Location: Right Arm, Patient Position: Sitting, Cuff Size: Normal)   Pulse (!) 58   Temp 98.3 F (36.8 C) (Tympanic)   Resp 20   Wt 219 lb 14.4 oz (99.7 kg)   BMI 29.82 kg/m  Well-developed well-nourished patient in NAD. HEENT reveals PERLA, EOMI, discs not visualized.  Oral cavity is clear. No oral mucosal lesions are identified. Neck is clear without evidence of cervical or supraclavicular adenopathy. Lungs are clear to A&P. Cardiac examination is essentially unremarkable with regular rate and rhythm without murmur rub or thrill. Abdomen is benign with no organomegaly or masses noted. Motor sensory and DTR levels are equal and symmetric in the upper and lower extremities. Cranial nerves II through XII are grossly intact. Proprioception is intact. No peripheral adenopathy or edema is identified. No motor or sensory levels are noted. Crude visual fields are within normal range.  RADIOLOGY RESULTS: No current films to review  PLAN: Present time patient is doing well under excellent biochemical control of his prostate cancer now out for years.  And  pleased with his overall progress.  At this time I believe patient can be followed by his PMD with annual P PSA.  Be happy to reevaluate him anytime should that be indicated.  Patient is to call with any concerns.  I would like to take this opportunity to thank you for allowing me to participate in the care of your patient.Noreene Filbert, MD

## 2022-01-21 ENCOUNTER — Other Ambulatory Visit: Payer: Self-pay | Admitting: Family Medicine

## 2022-01-21 DIAGNOSIS — M7582 Other shoulder lesions, left shoulder: Secondary | ICD-10-CM

## 2022-01-21 DIAGNOSIS — S46012S Strain of muscle(s) and tendon(s) of the rotator cuff of left shoulder, sequela: Secondary | ICD-10-CM

## 2022-01-21 DIAGNOSIS — M7522 Bicipital tendinitis, left shoulder: Secondary | ICD-10-CM

## 2022-02-05 ENCOUNTER — Ambulatory Visit
Admission: RE | Admit: 2022-02-05 | Discharge: 2022-02-05 | Disposition: A | Discharge status: Home / Self Care | Payer: Medicare Other | Source: Ambulatory Visit | Attending: Family Medicine | Admitting: Family Medicine

## 2022-02-05 DIAGNOSIS — S46012S Strain of muscle(s) and tendon(s) of the rotator cuff of left shoulder, sequela: Secondary | ICD-10-CM

## 2022-02-05 DIAGNOSIS — M7522 Bicipital tendinitis, left shoulder: Secondary | ICD-10-CM

## 2022-02-05 DIAGNOSIS — M7582 Other shoulder lesions, left shoulder: Secondary | ICD-10-CM

## 2022-08-15 ENCOUNTER — Other Ambulatory Visit: Payer: Medicare Other

## 2022-08-15 DIAGNOSIS — R7989 Other specified abnormal findings of blood chemistry: Secondary | ICD-10-CM

## 2022-08-15 DIAGNOSIS — C61 Malignant neoplasm of prostate: Secondary | ICD-10-CM

## 2022-08-16 LAB — TESTOSTERONE: Testosterone: 170 ng/dL — ABNORMAL LOW (ref 264–916)

## 2022-08-16 LAB — PSA: Prostate Specific Ag, Serum: 0.2 ng/mL (ref 0.0–4.0)

## 2022-08-20 ENCOUNTER — Ambulatory Visit: Payer: Medicare Other | Admitting: Urology

## 2022-09-05 ENCOUNTER — Ambulatory Visit: Payer: Medicare Other | Admitting: Urology

## 2022-09-05 ENCOUNTER — Encounter: Payer: Self-pay | Admitting: Urology

## 2022-09-05 VITALS — BP 153/78 | HR 75 | Ht 72.0 in | Wt 225.0 lb

## 2022-09-05 DIAGNOSIS — E291 Testicular hypofunction: Secondary | ICD-10-CM | POA: Diagnosis not present

## 2022-09-05 DIAGNOSIS — C61 Malignant neoplasm of prostate: Secondary | ICD-10-CM

## 2022-09-05 DIAGNOSIS — R7989 Other specified abnormal findings of blood chemistry: Secondary | ICD-10-CM

## 2022-09-05 NOTE — Progress Notes (Signed)
I, DeAsia L Maxie,acting as a scribe for Riki Altes, MD.,have documented all relevant documentation on the behalf of Riki Altes, MD,as directed by  Riki Altes, MD while in the presence of Riki Altes, MD.   09/05/22 1:33 PM   Roy Gentry [birth year] [id number]  Referring provider: Gracelyn Nurse, MD [address] [place],  [place] [address]  Chief Complaint  Patient presents with   Prostate Cancer    Urologic history: 1.T2b high risk prostate cancer -Biopsy 10/2017; PSA 1.5; right apical nodule with induration -Volume 44 g; Gleason 4+/4+5 adenocarcinoma -IMRT + brachytherapy + ADT (24 months) -Brachytherapy 02/2018  HPI: 72 y.o. male presents for follow-up for prostate cancer.   He saw Dr. Rushie Chestnut 12/2021 and was doing well with a PSA of 0.10. He was released from radiation oncology follow up. Since the last visit his only complaint is GI upset after eating breakfast No bothersome LUTS Testosterone level is improving and was 170 PSA 08/15/22 0.2   PMH: Past Medical History:  Diagnosis Date   Asthma    Cancer    Family history of adverse reaction to anesthesia    ponv    Surgical History: Past Surgical History:  Procedure Laterality Date   heel fracture Bilateral    RADIOACTIVE SEED IMPLANT N/A 03/09/2018   Procedure: RADIOACTIVE SEED IMPLANT/BRACHYTHERAPY IMPLANT;  Surgeon: Riki Altes, MD;  Location: [facility] ORS;  Service: Urology;  Laterality: N/A;   TONSILECTOMY, ADENOIDECTOMY, BILATERAL MYRINGOTOMY AND TUBES      Home Medications:  Allergies as of 09/05/2022       Reactions   Simvastatin Other (See Comments)   Joint pain   Donnatal [pb-hyoscy-atropine-scopolamine] Rash        Medication List        Accurate as of September 05, 2022  1:33 PM. If you have any questions, ask your nurse or doctor.          calcium-vitamin D 500-200 MG-UNIT tablet Commonly known as: OSCAL WITH D Take 1 tablet by mouth.    diphenhydrAMINE 25 MG tablet Commonly known as: BENADRYL Take 25 mg by mouth daily as needed for allergies.   Melatonin 5 MG Chew Chew 5 mg by mouth at bedtime as needed (sleep).   mometasone 0.1 % lotion Commonly known as: ELOCON SMARTSIG:In Ear(s) Twice Daily PRN   Naproxen Sod-diphenhydrAMINE 220-25 MG Tabs Take 1 tablet by mouth at bedtime as needed (sleep).   Red Yeast Rice 600 MG Tabs Take 2 tablets by mouth daily.   rosuvastatin 10 MG tablet Commonly known as: CRESTOR Take 10 mg by mouth daily.        Allergies:  Allergies  Allergen Reactions   Simvastatin Other (See Comments)    Joint pain   Donnatal [Pb-Hyoscy-Atropine-Scopolamine] Rash    Family History: Family History  Problem Relation Age of Onset   Prostate cancer Neg Hx    Bladder Cancer Neg Hx    Kidney cancer Neg Hx     Social History:  reports that he has never smoked. He has never used smokeless tobacco. He reports current alcohol use of about 8.0 standard drinks of alcohol per week. He reports that he does not currently use drugs.   Physical Exam: BP (!) 153/78   Pulse 75   Ht 6' (1.829 m)   Wt 225 lb (102.1 kg)   BMI 30.52 kg/m   Constitutional:  Alert and oriented, No acute distress. HEENT: [place] AT, moist  mucus membranes.  Trachea midline, no masses. Cardiovascular: No clubbing, cyanosis, or edema. Respiratory: Normal respiratory effort, no increased work of breathing.  Assessment & Plan:    T2b high risk prostate cancer PSA remains low. Slightly increased, most likely secondary to improving testosterone level. Schedule 6 month lab visit with PSA One year office visit with PSA/testosterone  I have reviewed the above documentation for accuracy and completeness, and I agree with the above.   Riki Altes, MD  [facility] [address] [place], [place] [address] [phone]

## 2022-09-06 ENCOUNTER — Encounter: Payer: Self-pay | Admitting: Urology

## 2023-03-06 ENCOUNTER — Other Ambulatory Visit: Payer: Medicare Other

## 2023-03-06 DIAGNOSIS — R7989 Other specified abnormal findings of blood chemistry: Secondary | ICD-10-CM

## 2023-03-06 DIAGNOSIS — C61 Malignant neoplasm of prostate: Secondary | ICD-10-CM

## 2023-03-07 LAB — PSA: Prostate Specific Ag, Serum: 0.4 ng/mL (ref 0.0–4.0)

## 2023-03-07 LAB — TESTOSTERONE: Testosterone: 176 ng/dL — ABNORMAL LOW (ref 264–916)

## 2023-06-19 ENCOUNTER — Other Ambulatory Visit: Payer: Medicare Other

## 2023-06-19 ENCOUNTER — Other Ambulatory Visit: Payer: Self-pay | Admitting: *Deleted

## 2023-06-19 DIAGNOSIS — C61 Malignant neoplasm of prostate: Secondary | ICD-10-CM

## 2023-06-20 LAB — PSA: Prostate Specific Ag, Serum: 0.5 ng/mL (ref 0.0–4.0)

## 2023-06-24 ENCOUNTER — Ambulatory Visit: Payer: Medicare Other | Admitting: Urology

## 2023-06-26 ENCOUNTER — Ambulatory Visit: Payer: Medicare Other | Admitting: Urology

## 2023-08-26 ENCOUNTER — Ambulatory Visit: Payer: Self-pay

## 2023-08-26 DIAGNOSIS — Z860100 Personal history of colon polyps, unspecified: Secondary | ICD-10-CM | POA: Diagnosis not present

## 2023-08-26 DIAGNOSIS — D122 Benign neoplasm of ascending colon: Secondary | ICD-10-CM | POA: Diagnosis not present

## 2023-08-26 DIAGNOSIS — Z09 Encounter for follow-up examination after completed treatment for conditions other than malignant neoplasm: Secondary | ICD-10-CM | POA: Diagnosis present

## 2023-08-26 DIAGNOSIS — K635 Polyp of colon: Secondary | ICD-10-CM | POA: Diagnosis not present

## 2023-08-26 DIAGNOSIS — K640 First degree hemorrhoids: Secondary | ICD-10-CM | POA: Diagnosis not present

## 2023-08-26 DIAGNOSIS — K5730 Diverticulosis of large intestine without perforation or abscess without bleeding: Secondary | ICD-10-CM | POA: Diagnosis not present

## 2023-08-26 DIAGNOSIS — D175 Benign lipomatous neoplasm of intra-abdominal organs: Secondary | ICD-10-CM | POA: Diagnosis not present

## 2023-08-28 ENCOUNTER — Other Ambulatory Visit: Payer: Self-pay

## 2023-08-28 DIAGNOSIS — R7989 Other specified abnormal findings of blood chemistry: Secondary | ICD-10-CM

## 2023-08-28 DIAGNOSIS — C61 Malignant neoplasm of prostate: Secondary | ICD-10-CM

## 2023-09-04 ENCOUNTER — Other Ambulatory Visit: Payer: Medicare Other

## 2023-09-04 DIAGNOSIS — R7989 Other specified abnormal findings of blood chemistry: Secondary | ICD-10-CM

## 2023-09-04 DIAGNOSIS — C61 Malignant neoplasm of prostate: Secondary | ICD-10-CM

## 2023-09-05 LAB — PSA: Prostate Specific Ag, Serum: 0.8 ng/mL (ref 0.0–4.0)

## 2023-09-05 LAB — TESTOSTERONE: Testosterone: 207 ng/dL — ABNORMAL LOW (ref 264–916)

## 2023-09-10 ENCOUNTER — Ambulatory Visit: Payer: Self-pay | Admitting: Urology

## 2023-09-10 ENCOUNTER — Encounter: Payer: Self-pay | Admitting: Urology

## 2023-09-10 VITALS — BP 129/77 | HR 68 | Ht 72.0 in | Wt 220.0 lb

## 2023-09-10 DIAGNOSIS — R3912 Poor urinary stream: Secondary | ICD-10-CM

## 2023-09-10 DIAGNOSIS — R3911 Hesitancy of micturition: Secondary | ICD-10-CM

## 2023-09-10 DIAGNOSIS — C61 Malignant neoplasm of prostate: Secondary | ICD-10-CM

## 2023-09-10 MED ORDER — TAMSULOSIN HCL 0.4 MG PO CAPS: 0.4000 mg | ORAL_CAPSULE | Freq: Every day | ORAL | 3 refills | Status: DC

## 2023-09-10 NOTE — Progress Notes (Signed)
 I, Maysun Jamey Mccallum, acting as a Neurosurgeon for Geraline Knapp, MD., have documented all relevant documentation on the behalf of Geraline Knapp, MD, as directed by Geraline Knapp, MD while in the presence of Geraline Knapp, MD.  09/10/2023 11:20 PM   Roy Gentry 11-08-1950 [id number]  Referring provider: Little Riff, MD [address] [facility] [address] [facility] [place],  [place] [address]  Chief Complaint  Patient presents with   Prostate Cancer   Urologic history: T2b high risk prostate cancer Biopsy 10/2017; PSA 1.5; right apical nodule with induration Volume 44 g; Gleason 4+/4+5 adenocarcinoma IMRT + brachytherapy + ADT (24 months) Brachytherapy 02/2018  HPI: Roy Gentry is a 73 y.o. male presents for annual follow-up.  Since last year's visit, he has had worsening urinary hesitancy and decreased force and caliber of his urinary stream.  Denies dysuria, gross hematuria No flank, abdominal, or pelvic pain.  Labs 09/04/2023 PSA 0.8/testosterone  207  PSA trend  Prostate Specific Ag, Serum  Latest Ref Rng 0.0 - 4.0 ng/mL  08/11/2019 <0.1   08/16/2020 <0.1   03/11/2021 <0.1   08/13/2021 <0.1   08/15/2022 0.2   03/06/2023 0.4   06/19/2023 0.5   09/04/2023 0.8      PMH: Past Medical History:  Diagnosis Date   Asthma    Cancer ([facility])    Family history of adverse reaction to anesthesia    ponv    Surgical History: Past Surgical History:  Procedure Laterality Date   heel fracture Bilateral    RADIOACTIVE SEED IMPLANT N/A 03/09/2018   Procedure: RADIOACTIVE SEED IMPLANT/BRACHYTHERAPY IMPLANT;  Surgeon: Geraline Knapp, MD;  Location: [facility] ORS;  Service: Urology;  Laterality: N/A;   TONSILECTOMY, ADENOIDECTOMY, BILATERAL MYRINGOTOMY AND TUBES      Home Medications:  Allergies as of 09/10/2023       Reactions   Simvastatin Other (See Comments)   Joint pain   Donnatal [pb-hyoscy-atropine-scopolamine] Rash        Medication List        Accurate as of September 10, 2023 11:20 PM. If you have any questions, ask your nurse or doctor.          STOP taking these medications    Melatonin 5 MG Chew Stopped by: Kerie Badger C Lorien Shingler   Red Yeast Rice 600 MG Tabs Stopped by: Geraline Knapp       TAKE these medications    calcium-vitamin D 500-200 MG-UNIT tablet Commonly known as: OSCAL WITH D Take 1 tablet by mouth.   diphenhydrAMINE 25 MG tablet Commonly known as: BENADRYL Take 25 mg by mouth daily as needed for allergies.   mometasone 0.1 % lotion Commonly known as: ELOCON SMARTSIG:In Ear(s) Twice Daily PRN   Naproxen Sod-diphenhydrAMINE 220-25 MG Tabs Take 1 tablet by mouth at bedtime as needed (sleep).   rosuvastatin 10 MG tablet Commonly known as: CRESTOR Take 10 mg by mouth daily.   tamsulosin  0.4 MG Caps capsule Commonly known as: FLOMAX  Take 1 capsule (0.4 mg total) by mouth daily. Started by: Geraline Knapp        Allergies:  Allergies  Allergen Reactions   Simvastatin Other (See Comments)    Joint pain   Donnatal [Pb-Hyoscy-Atropine-Scopolamine] Rash    Family History: Family History  Problem Relation Age of Onset   Prostate cancer Neg Hx    Bladder Cancer Neg Hx    Kidney cancer Neg Hx     Social History:  reports that he has never smoked. He has never used smokeless tobacco. He reports current alcohol use of about 8.0 standard drinks of alcohol per week. He reports that he does not currently use drugs.   Physical Exam: BP 129/77   Pulse 68   Ht 6' (1.829 m)   Wt 220 lb (99.8 kg)   BMI 29.84 kg/m   Constitutional:  Alert and oriented, No acute distress. HEENT: [place] AT Psychiatric: Normal mood and affect.   Assessment & Plan:    1. T2b high-risk prostate cancer PSA slightly higher at 0.8 Will continue to follow and repeat lab visit 6 months for PSA  2. Obstructive voiding symptoms Worsening obstructive voiding symptoms since his last visit.  Trial tamsulosin  4 mg daily  If no significant  improvement in his obstructive symptoms, will schedule a cystoscopy to evaluate for the possibility of urethral stricture.   I have reviewed the above documentation for accuracy and completeness, and I agree with the above.   Geraline Knapp, MD  [facility] [address], [address] [place], [place] [address] [phone]

## 2023-09-11 ENCOUNTER — Ambulatory Visit: Payer: Medicare Other | Admitting: Urology

## 2023-10-28 ENCOUNTER — Ambulatory Visit: Admitting: Urology

## 2023-10-28 ENCOUNTER — Encounter: Payer: Self-pay | Admitting: Urology

## 2023-10-28 VITALS — BP 136/70 | HR 97 | Ht 72.0 in | Wt 217.0 lb

## 2023-10-28 DIAGNOSIS — C61 Malignant neoplasm of prostate: Secondary | ICD-10-CM

## 2023-10-28 DIAGNOSIS — R399 Unspecified symptoms and signs involving the genitourinary system: Secondary | ICD-10-CM

## 2023-10-28 LAB — URINALYSIS, COMPLETE
Bilirubin, UA: NEGATIVE
Glucose, UA: NEGATIVE
Leukocytes,UA: NEGATIVE
Nitrite, UA: NEGATIVE
Protein,UA: NEGATIVE
RBC, UA: NEGATIVE
Specific Gravity, UA: 1.025 (ref 1.005–1.030)
Urobilinogen, Ur: 0.2 mg/dL (ref 0.2–1.0)
pH, UA: 6 (ref 5.0–7.5)

## 2023-10-28 LAB — MICROSCOPIC EXAMINATION: Bacteria, UA: NONE SEEN

## 2023-10-28 MED ORDER — SILODOSIN 8 MG PO CAPS: 8.0000 mg | ORAL_CAPSULE | Freq: Every day | ORAL | 0 refills | Status: DC

## 2023-10-28 NOTE — Progress Notes (Signed)
   10/28/23  CC:  Chief Complaint  Patient presents with   Cysto    HPI: Refer to my prior office note 09/10/2023.  No significant improvement in his voiding symptoms on tamsulosin .  Blood pressure 136/70, pulse 97, height 6' (1.829 m), weight 217 lb (98.4 kg).  Cystoscopy Procedure Note  Patient identification was confirmed, informed consent was obtained, and patient was prepped using Betadine solution.  Lidocaine  jelly was administered per urethral meatus.     Pre-Procedure: - Inspection reveals a normal caliber urethral meatus.  Procedure: The flexible cystoscope was introduced without difficulty - No urethral strictures/lesions are present. - "Tight" prostatic urethra without stricture or contracture; pale mucosa with prominent hypervascularity consistent with radiation change  - Tight bladder neck - Bilateral ureteral orifices identified - Bladder mucosa  reveals no ulcers, tumors, or lesions - No bladder stones - Moderate trabeculation  Retroflexion shows hypervascularity bladder neck   Post-Procedure: - Patient tolerated the procedure well  Assessment/ Plan: Radiation changes prostatic urethra Trial silodosin 8 mg daily for his lower urinary tract symptoms Repeat PSA August 2025   Geraline Knapp, MD

## 2023-10-29 ENCOUNTER — Ambulatory Visit (INDEPENDENT_AMBULATORY_CARE_PROVIDER_SITE_OTHER): Payer: Self-pay | Admitting: Vascular Surgery

## 2023-10-29 ENCOUNTER — Encounter (INDEPENDENT_AMBULATORY_CARE_PROVIDER_SITE_OTHER): Payer: Self-pay | Admitting: Vascular Surgery

## 2023-10-29 VITALS — BP 138/72 | HR 65 | Resp 18 | Wt 217.8 lb

## 2023-10-29 DIAGNOSIS — I7143 Infrarenal abdominal aortic aneurysm, without rupture: Secondary | ICD-10-CM | POA: Diagnosis not present

## 2023-10-29 DIAGNOSIS — I716 Thoracoabdominal aortic aneurysm, without rupture, unspecified: Secondary | ICD-10-CM | POA: Diagnosis not present

## 2023-10-29 DIAGNOSIS — I724 Aneurysm of artery of lower extremity: Secondary | ICD-10-CM | POA: Diagnosis not present

## 2023-11-01 ENCOUNTER — Encounter (INDEPENDENT_AMBULATORY_CARE_PROVIDER_SITE_OTHER): Payer: Self-pay | Admitting: Vascular Surgery

## 2023-11-01 DIAGNOSIS — I714 Abdominal aortic aneurysm, without rupture, unspecified: Secondary | ICD-10-CM | POA: Insufficient documentation

## 2023-11-01 DIAGNOSIS — I716 Thoracoabdominal aortic aneurysm, without rupture, unspecified: Secondary | ICD-10-CM | POA: Insufficient documentation

## 2023-11-01 DIAGNOSIS — I724 Aneurysm of artery of lower extremity: Secondary | ICD-10-CM | POA: Insufficient documentation

## 2023-11-01 NOTE — Progress Notes (Signed)
 MRN : [id number]  Roy Gentry is a 73 y.o. ([birth year]) male who presents with chief complaint of check circulation.  History of Present Illness:   The patient presents to the office for evaluation of an ascending thoracic aortic aneurysm. The aneurysm was found incidentally by CT scan. Patient denies chest pain or unusual back pain, no other chest or abdominal complaints.  No history of an abrupt onset of a painful toe associated with blue discoloration.     No family history of TAA/AAA.   Patient denies amaurosis fugax or TIA symptoms.  There is no history of claudication or rest pain symptoms of the lower extremities.   The patient denies angina or shortness of breath.   Current Meds  Medication Sig   calcium-vitamin D (OSCAL WITH D) 500-200 MG-UNIT tablet Take 1 tablet by mouth.   diphenhydrAMINE (BENADRYL) 25 MG tablet Take 25 mg by mouth daily as needed for allergies.   mometasone (ELOCON) 0.1 % lotion SMARTSIG:In Ear(s) Twice Daily PRN    Past Medical History:  Diagnosis Date   Asthma    Cancer (HCC)    Family history of adverse reaction to anesthesia    ponv    Past Surgical History:  Procedure Laterality Date   heel fracture Bilateral    RADIOACTIVE SEED IMPLANT N/A 03/09/2018   Procedure: RADIOACTIVE SEED IMPLANT/BRACHYTHERAPY IMPLANT;  Surgeon: Geraline Knapp, MD;  Location: [facility] ORS;  Service: Urology;  Laterality: N/A;   TONSILECTOMY, ADENOIDECTOMY, BILATERAL MYRINGOTOMY AND TUBES      Social History Social History   Tobacco Use   Smoking status: Never   Smokeless tobacco: Never  Vaping Use   Vaping status: Never Used  Substance Use Topics   Alcohol use: Yes    Alcohol/week: 8.0 standard drinks of alcohol    Types: 6 Cans of beer, 2 Shots of liquor per week   Drug use: Not Currently    Family History Family History  Problem Relation Age of Onset   Prostate cancer Neg Hx     Bladder Cancer Neg Hx    Kidney cancer Neg Hx     Allergies  Allergen Reactions   Simvastatin Other (See Comments)    Joint pain   Donnatal [Pb-Hyoscy-Atropine-Scopolamine] Rash     REVIEW OF SYSTEMS (Negative unless checked)  Constitutional: [] Weight loss  [] Fever  [] Chills Cardiac: [] Chest pain   [] Chest pressure   [] Palpitations   [] Shortness of breath when laying flat   [] Shortness of breath with exertion. Vascular:  [x] Pain in legs with walking   [] Pain in legs at rest  [] History of DVT   [] Phlebitis   [] Swelling in legs   [] Varicose veins   [] Non-healing ulcers Pulmonary:   [] Uses home oxygen   [] Productive cough   [] Hemoptysis   [] Wheeze  [] COPD   [] Asthma Neurologic:  [] Dizziness   [] Seizures   [] History of stroke   [] History of TIA  [] Aphasia   [] Vissual changes   [] Weakness or numbness in arm   [] Weakness or numbness in leg Musculoskeletal:   [] Joint swelling   [] Joint pain   []   Low back pain Hematologic:  [] Easy bruising  [] Easy bleeding   [] Hypercoagulable state   [] Anemic Gastrointestinal:  [] Diarrhea   [] Vomiting  [] Gastroesophageal reflux/heartburn   [] Difficulty swallowing. Genitourinary:  [] Chronic kidney disease   [] Difficult urination  [] Frequent urination   [] Blood in urine Skin:  [] Rashes   [] Ulcers  Psychological:  [] History of anxiety   []  History of major depression.  Physical Examination  Vitals:   10/29/23 1347  BP: 138/72  Pulse: 65  Resp: 18  Weight: 217 lb 12.8 oz (98.8 kg)   Body mass index is 29.54 kg/m. Gen: WD/WN, NAD Head: [place]/AT, No temporalis wasting.  Ear/Nose/Throat: Hearing grossly intact, nares w/o erythema or drainage Eyes: PER, EOMI, sclera nonicteric.  Neck: Supple, no masses.  No bruit or JVD.  Pulmonary:  Good air movement, no audible wheezing, no use of accessory muscles.  Cardiac: RRR, normal S1, S2, no Murmurs. Vascular:  mild trophic changes, no open wounds Vessel Right Left  Radial Palpable Palpable  Popliteal  Palpable  Broadened  Gastrointestinal: soft, non-distended. No guarding/no peritoneal signs.  Musculoskeletal: M/S 5/5 throughout.  No visible deformity.  Neurologic: CN 2-12 intact. Pain and light touch intact in extremities.  Symmetrical.  Speech is fluent. Motor exam as listed above. Psychiatric: Judgment intact, Mood & affect appropriate for pt's clinical situation. Dermatologic: No rashes or ulcers noted.  No changes consistent with cellulitis.   CBC Lab Results  Component Value Date   WBC 4.3 03/03/2018   HGB 14.0 03/03/2018   HCT 42.1 03/03/2018   MCV 93.3 03/03/2018   PLT 240 03/03/2018    BMET    Component Value Date/Time   CREATININE 0.80 11/24/2017 1033   CrCl cannot be calculated (Patient's most recent lab result is older than the maximum 21 days allowed.).  COAG No results found for: "INR", "PROTIME"  Radiology No results found.   Assessment/Plan 1. Thoracoabdominal aortic aneurysm (TAAA) without rupture, unspecified part (HCC) (Primary) Recommend:  No surgery or intervention is indicated at this time.  The patient has an asymptomatic thoracic aortic aneurysm that is less than 6.0 cm in maximal diameter.  I have discussed the natural history of thoracic aortic aneurysm and the small risk of rupture for aneurysm less than 6.5 cm in size.  However, as these small aneurysms tend to enlarge over time, continued surveillance with CT scan is mandatory.   I have also discussed optimizing medical management with hypertension and lipid control and the negative effect that any tobacco products have on aneurysmal disease.  The patient is also encouraged to exercise a minimum of 30 minutes 4 times a week.   Should the patient develop new onset chest or back pain or signs of peripheral embolization they are instructed to seek medical attention immediately and to alert the physician providing care that they have an aneurysm in the chest.   The patient voices their  understanding.  The patient will return as ordered with a CT scan of the chest  2. Infrarenal abdominal aortic aneurysm (AAA) without rupture (HCC) Recommend:  No surgery or intervention is indicated at this time.  Duplex ultrasound will be obtained.  If the patient has an asymptomatic abdominal aortic aneurysm that is greater than 4 cm but less than 5 cm in maximal diameter.    I have reviewed the natural history of abdominal aortic aneurysm and the small risk of rupture for aneurysm less than 5 cm in size.  However, as these small aneurysms tend to enlarge over  time, continued surveillance with ultrasound or CT scan is mandatory.   I have also discussed optimizing medical management with hypertension and lipid control and the negative effect that any tobacco products have on aneurysmal disease.  The patient is also encouraged to exercise a minimum of 30 minutes 4 times a week.   Should the patient develop new onset abdominal or back pain or signs of peripheral embolization they are instructed to seek medical attention immediately and to alert the physician providing care that they have an aneurysm.   The patient voices their understanding.  I have scheduled the patient to return in 6 months with an aortic duplex.  3. Popliteal artery aneurysm (HCC) Recommend: No surgery or intervention is indicated at this time.  Duplex ultrasound will be obtained if the patient has an asymptomatic popliteal artery aneurysm that is less than 2.5 cm in maximal diameter.  I have discussed the natural history of popliteal aneurysm and the small risk of thrombosis for aneurysm less than 2.5 cm in size.  However, as these small aneurysms tend to enlarge over time, continued surveillance with ultrasound is mandatory.   I have also discussed optimizing medical management with hypertension and lipid control and the negative effect that any tobacco products have on aneurysmal disease.  The patient is also  encouraged to exercise a minimum of 30 minutes 4 times a week.   Should the patient develop new leg pain or signs of peripheral embolization they are instructed to seek medical attention immediately and to alert the physician providing care that they have an aneurysm behind the knee.   The patient voices their understanding.  The patient will follow up as scheduled with duplex ultrasound of the lower extremities for surveillance.    Devon Fogo, MD  11/01/2023 8:29 PM

## 2023-12-04 ENCOUNTER — Other Ambulatory Visit (INDEPENDENT_AMBULATORY_CARE_PROVIDER_SITE_OTHER): Payer: Self-pay | Admitting: Vascular Surgery

## 2023-12-04 DIAGNOSIS — I7143 Infrarenal abdominal aortic aneurysm, without rupture: Secondary | ICD-10-CM

## 2023-12-04 DIAGNOSIS — I724 Aneurysm of artery of lower extremity: Secondary | ICD-10-CM

## 2023-12-08 ENCOUNTER — Other Ambulatory Visit: Payer: Self-pay | Admitting: *Deleted

## 2023-12-10 ENCOUNTER — Other Ambulatory Visit (INDEPENDENT_AMBULATORY_CARE_PROVIDER_SITE_OTHER)

## 2023-12-10 ENCOUNTER — Ambulatory Visit (INDEPENDENT_AMBULATORY_CARE_PROVIDER_SITE_OTHER): Admitting: Vascular Surgery

## 2023-12-10 ENCOUNTER — Ambulatory Visit (INDEPENDENT_AMBULATORY_CARE_PROVIDER_SITE_OTHER)

## 2023-12-10 ENCOUNTER — Encounter (INDEPENDENT_AMBULATORY_CARE_PROVIDER_SITE_OTHER): Payer: Self-pay | Admitting: Vascular Surgery

## 2023-12-10 VITALS — BP 142/79 | HR 57 | Resp 18 | Wt 216.4 lb

## 2023-12-10 DIAGNOSIS — I724 Aneurysm of artery of lower extremity: Secondary | ICD-10-CM

## 2023-12-10 DIAGNOSIS — I7143 Infrarenal abdominal aortic aneurysm, without rupture: Secondary | ICD-10-CM

## 2023-12-10 DIAGNOSIS — E782 Mixed hyperlipidemia: Secondary | ICD-10-CM

## 2023-12-10 DIAGNOSIS — I716 Thoracoabdominal aortic aneurysm, without rupture, unspecified: Secondary | ICD-10-CM | POA: Diagnosis not present

## 2023-12-10 DIAGNOSIS — E785 Hyperlipidemia, unspecified: Secondary | ICD-10-CM | POA: Insufficient documentation

## 2023-12-10 MED ORDER — TAMSULOSIN HCL 0.4 MG PO CAPS: 0.4000 mg | ORAL_CAPSULE | Freq: Every day | ORAL | 3 refills | Status: DC

## 2023-12-10 NOTE — Progress Notes (Unsigned)
 MRN : [id number]  Roy Gentry is a 73 y.o. (07/13/1950) male who presents with chief complaint of check circulation.  History of Present Illness:   The patient presents to the office for follow-up of an ascending thoracic aortic aneurysm and abdominal aortic aneurysm. The aneurysm was found incidentally by CT scan. Patient denies chest pain or unusual back pain, no other chest or abdominal complaints.  No history of an abrupt onset of a painful toe associated with blue discoloration.      No family history of TAA/AAA.    Patient denies amaurosis fugax or TIA symptoms.  There is no history of claudication or rest pain symptoms of the lower extremities.   The patient denies angina or shortness of breath.  Duplex ultrasound of the abdominal aorta performed 12/10/2023 is reviewed by me personally and demonstrates a abdominal aortic aneurysm measuring 2.9 cm.  No evidence of hemodynamically significant stenosis of the iliac arteries.  Duplex ultrasound of the bilateral lower extremities dated 12/10/2023 is reviewed by me and demonstrates normal-sized popliteal arteries with triphasic flow.  No evidence of popliteal artery aneurysm.  No outpatient medications have been marked as taking for the 12/10/23 encounter (Office Visit) with Jama, Cordella MATSU, MD.    Past Medical History:  Diagnosis Date   Asthma    Cancer [facility])    Family history of adverse reaction to anesthesia    ponv    Past Surgical History:  Procedure Laterality Date   heel fracture Bilateral    RADIOACTIVE SEED IMPLANT N/A 03/09/2018   Procedure: RADIOACTIVE SEED IMPLANT/BRACHYTHERAPY IMPLANT;  Surgeon: Twylla Glendia BROCKS, MD;  Location: [facility] ORS;  Service: Urology;  Laterality: N/A;   TONSILECTOMY, ADENOIDECTOMY, BILATERAL MYRINGOTOMY AND TUBES      Social History Social History   Tobacco Use   Smoking status: Never   Smokeless tobacco: Never   Vaping Use   Vaping status: Never Used  Substance Use Topics   Alcohol use: Yes    Alcohol/week: 8.0 standard drinks of alcohol    Types: 6 Cans of beer, 2 Shots of liquor per week   Drug use: Not Currently    Family History Family History  Problem Relation Age of Onset   Prostate cancer Neg Hx    Bladder Cancer Neg Hx    Kidney cancer Neg Hx     Allergies  Allergen Reactions   Simvastatin Other (See Comments)    Joint pain   Donnatal [Pb-Hyoscy-Atropine-Scopolamine] Rash     REVIEW OF SYSTEMS (Negative unless checked)  Constitutional: [] Weight loss  [] Fever  [] Chills Cardiac: [] Chest pain   [] Chest pressure   [] Palpitations   [] Shortness of breath when laying flat   [] Shortness of breath with exertion. Vascular:  [x] Pain in legs with walking   [] Pain in legs at rest  [] History of DVT   [] Phlebitis   [] Swelling in legs   [] Varicose veins   [] Non-healing ulcers Pulmonary:   [] Uses home oxygen   [] Productive cough   [] Hemoptysis   [] Wheeze  [] COPD   [] Asthma Neurologic:  [] Dizziness   [] Seizures   [] History  of stroke   [] History of TIA  [] Aphasia   [] Vissual changes   [] Weakness or numbness in arm   [] Weakness or numbness in leg Musculoskeletal:   [] Joint swelling   [] Joint pain   [] Low back pain Hematologic:  [] Easy bruising  [] Easy bleeding   [] Hypercoagulable state   [] Anemic Gastrointestinal:  [] Diarrhea   [] Vomiting  [] Gastroesophageal reflux/heartburn   [] Difficulty swallowing. Genitourinary:  [] Chronic kidney disease   [] Difficult urination  [] Frequent urination   [] Blood in urine Skin:  [] Rashes   [] Ulcers  Psychological:  [] History of anxiety   []  History of major depression.  Physical Examination  Vitals:   12/10/23 1025  BP: (!) 142/79  Pulse: (!) 57  Resp: 18  Weight: 216 lb 6.4 oz (98.2 kg)   Body mass index is 29.35 kg/m. Gen: WD/WN, NAD Head: [place]/AT, No temporalis wasting.  Ear/Nose/Throat: Hearing grossly intact, nares w/o erythema or drainage Eyes:  PER, EOMI, sclera nonicteric.  Neck: Supple, no masses.  No bruit or JVD.  Pulmonary:  Good air movement, no audible wheezing, no use of accessory muscles.  Cardiac: RRR, normal S1, S2, no Murmurs. Vascular:  mild trophic changes, no open wounds Vessel Right Left  Radial Palpable Palpable  Popliteal Palpable  Palpable  Gastrointestinal: soft, non-distended. No guarding/no peritoneal signs.  Musculoskeletal: M/S 5/5 throughout.  No visible deformity.  Neurologic: CN 2-12 intact. Pain and light touch intact in extremities.  Symmetrical.  Speech is fluent. Motor exam as listed above. Psychiatric: Judgment intact, Mood & affect appropriate for pt's clinical situation. Dermatologic: No rashes or ulcers noted.  No changes consistent with cellulitis.   CBC Lab Results  Component Value Date   WBC 4.3 03/03/2018   HGB 14.0 03/03/2018   HCT 42.1 03/03/2018   MCV 93.3 03/03/2018   PLT 240 03/03/2018    BMET    Component Value Date/Time   CREATININE 0.80 11/24/2017 1033   CrCl cannot be calculated (Patient's most recent lab result is older than the maximum 21 days allowed.).  COAG No results found for: INR, PROTIME  Radiology No results found.   Assessment/Plan 1. Thoracoabdominal aortic aneurysm (TAAA) without rupture, unspecified part (HCC) (Primary) Recommend:  No surgery or intervention is indicated at this time.  The patient has an asymptomatic thoracic aortic aneurysm that is less than 6.0 cm in maximal diameter.  I have discussed the natural history of thoracic aortic aneurysm and the small risk of rupture for aneurysm less than 6.5 cm in size.  However, as these small aneurysms tend to enlarge over time, continued surveillance with CT scan is mandatory.   I have also discussed optimizing medical management with hypertension and lipid control and the negative effect that any tobacco products have on aneurysmal disease.  The patient is also encouraged to exercise a  minimum of 30 minutes 4 times a week.   Should the patient develop new onset chest or back pain or signs of peripheral embolization they are instructed to seek medical attention immediately and to alert the physician providing care that they have an aneurysm in the chest.   The patient voices their understanding.  The patient will return as ordered in 2 years with a CT scan of the chest  2. Infrarenal abdominal aortic aneurysm (AAA) without rupture (HCC) Recommend: No surgery or intervention is indicated at this time.  The patient has an asymptomatic abdominal aortic aneurysm that is less than 4 cm in maximal diameter.    I have reviewed the  natural history of abdominal aortic aneurysm and the small risk of rupture for aneurysm less than 5 cm in size.  However, as these small aneurysms tend to enlarge over time, continued surveillance with ultrasound or CT scan is mandatory.   I have also discussed optimizing medical management with hypertension and lipid control and the negative effect that any tobacco products have on aneurysmal disease.  The patient is also encouraged to exercise a minimum of 30 minutes 4 times a week.   Should the patient develop new onset abdominal or back pain or signs of peripheral embolization they are instructed to seek medical attention immediately and to alert the physician providing care that they have an aneurysm.   The patient voices their understanding.  The patient will return in 24 months with an aortic duplex.  3. Popliteal artery aneurysm (HCC) Duplex ultrasound of the bilateral lower extremities dated 12/10/2023 is reviewed by me and demonstrates normal-sized popliteal arteries with triphasic flow.  No evidence of popliteal artery aneurysm.  Given the findings of his duplex ultrasound no further treatment or intervention or invasive testing is warranted.  Will continue to follow his thoracic and abdominal aneurysmal disease as noted above.  4. Mixed  hyperlipidemia Continue statin as ordered and reviewed, no changes at this time    Cordella Shawl, MD  12/10/2023 10:30 AM

## 2023-12-11 ENCOUNTER — Encounter (INDEPENDENT_AMBULATORY_CARE_PROVIDER_SITE_OTHER): Payer: Self-pay | Admitting: Vascular Surgery

## 2024-01-08 ENCOUNTER — Other Ambulatory Visit: Payer: Self-pay

## 2024-01-08 DIAGNOSIS — C61 Malignant neoplasm of prostate: Secondary | ICD-10-CM

## 2024-01-11 ENCOUNTER — Other Ambulatory Visit

## 2024-01-11 DIAGNOSIS — C61 Malignant neoplasm of prostate: Secondary | ICD-10-CM

## 2024-01-12 LAB — PSA: Prostate Specific Ag, Serum: 1 ng/mL (ref 0.0–4.0)

## 2024-01-15 ENCOUNTER — Ambulatory Visit: Payer: Self-pay | Admitting: Urology

## 2024-01-18 ENCOUNTER — Other Ambulatory Visit: Payer: Self-pay | Admitting: *Deleted

## 2024-01-18 DIAGNOSIS — C61 Malignant neoplasm of prostate: Secondary | ICD-10-CM

## 2024-01-26 ENCOUNTER — Ambulatory Visit
Admission: RE | Admit: 2024-01-26 | Discharge: 2024-01-26 | Disposition: A | Discharge status: Home / Self Care | Source: Ambulatory Visit | Attending: Urology | Admitting: Urology

## 2024-01-26 DIAGNOSIS — C61 Malignant neoplasm of prostate: Secondary | ICD-10-CM | POA: Diagnosis present

## 2024-01-26 MED ORDER — FLUDEOXYGLUCOSE F - 18 (FDG) INJECTION
8.0000 | Freq: Once | INTRAVENOUS | Status: AC | PRN
  Administered 2024-01-26: 8.38 via INTRAVENOUS

## 2024-01-27 ENCOUNTER — Telehealth: Payer: Self-pay

## 2024-01-27 DIAGNOSIS — C61 Malignant neoplasm of prostate: Secondary | ICD-10-CM

## 2024-01-27 NOTE — Telephone Encounter (Signed)
 Pt is concerned that he is having more urgency and frequency issue. He states decreased stream significantly. States no burning, fevers or nausea and vomiting. States no smelly or cloudy urine. Pt says he can't go more than 10-15 minutes with out having to go. He asked if we could up his Flomax . I stated I will forward to Dr. But that he might need a follow up to discuss new meds. Pt voiced understanding. Pt had PET scan completed yesterday.

## 2024-01-29 ENCOUNTER — Ambulatory Visit: Payer: Self-pay | Admitting: Urology

## 2024-01-29 NOTE — Telephone Encounter (Signed)
 Sected patient and complaining of increased urinary frequency and urgency.  No dysuria or hematuria.  Okay to initially increase tamsulosin  to 0.4 mg twice daily.  If no improvement he will contact the office next week for appointment for UA and bladder scan.  I also discussed his PSMA/PET results.  He had increased tracer avid uptake in the right prostate and also radiotracer avid distal right external iliac node and to right inguinal lymph nodes felt consistent with nodal metastasis.  We discussed the findings and recommended medical oncology referral.  Referral order was placed.

## 2024-01-29 NOTE — Addendum Note (Signed)
 Addended by: TWYLLA HAMILTON C on: 01/29/2024 02:07 PM   Modules accepted: Orders

## 2024-01-29 NOTE — Telephone Encounter (Signed)
 Left pt VM to return our call so we could give him the results. Pt does not have mychart. Left voicemail on 9/4

## 2024-02-05 ENCOUNTER — Encounter: Payer: Self-pay | Admitting: Internal Medicine

## 2024-02-05 ENCOUNTER — Other Ambulatory Visit

## 2024-02-05 ENCOUNTER — Telehealth: Payer: Self-pay | Admitting: Pharmacist

## 2024-02-05 ENCOUNTER — Inpatient Hospital Stay: Attending: Internal Medicine | Admitting: Internal Medicine

## 2024-02-05 ENCOUNTER — Other Ambulatory Visit: Payer: Self-pay | Admitting: Internal Medicine

## 2024-02-05 ENCOUNTER — Other Ambulatory Visit (HOSPITAL_COMMUNITY): Payer: Self-pay

## 2024-02-05 ENCOUNTER — Telehealth: Payer: Self-pay | Admitting: Pharmacy Technician

## 2024-02-05 VITALS — BP 123/77 | HR 92 | Temp 96.7°F | Resp 16 | Ht 72.0 in | Wt 217.1 lb

## 2024-02-05 DIAGNOSIS — Z79899 Other long term (current) drug therapy: Secondary | ICD-10-CM | POA: Diagnosis not present

## 2024-02-05 DIAGNOSIS — C61 Malignant neoplasm of prostate: Secondary | ICD-10-CM | POA: Diagnosis present

## 2024-02-05 MED ORDER — APALUTAMIDE 60 MG PO TABS
240.0000 mg | ORAL_TABLET | Freq: Every day | ORAL | 2 refills | Status: DC
  Filled 2024-03-04: qty 120, 30d supply, fill #0
  Filled 2024-03-30: qty 120, 30d supply, fill #1
  Filled 2024-04-22 – 2024-04-26 (×2): qty 120, 30d supply, fill #2

## 2024-02-05 NOTE — Progress Notes (Signed)
 Pain with urination, 4/10. Sees Dr. Twylla.

## 2024-02-05 NOTE — Assessment & Plan Note (Addendum)
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  Discussed that unfortunately patient's cancer cannot be cured.  All treatments are palliative and not curative; and treatments are in general indefinite [unless limited by toxicity]  Given the low volume disease I think is reasonable to hold off docetaxel chemotherapy.  For patients with low-volume, low-risk disease, we offer the combination of ADT with a novel hormonal agent [eg, enzalutamide, apalutamide , or abiraterone].  Recommend apalutamide  standard dosing.  Discussed the potential side effects including but not limited to skin rash diarrhea etc.  Otherwise well-tolerated.  Discussed with pharmacy.  #I  discussed at length the importance of starting ADT-to treat his progressive prostate cancer.  I reviewed the mechanism of action of ADT/blocking testosterone .  Again reviewed the potential side effects including but not limited to-fatigue hot flashes loss of libido.  Also reviewed that bone health/cardiovascular as long-term complications.  I would recommend Eligard .   # Prostatism symptoms [Dr. Stoioff]-currently on Flomax  2 pills; defer to urology.  # Hx of ascending thoracic aortic aneurysm and abdominal aortic aneurysm [Dr.Schnier]- stable.  Thank you Dr.Stoioff MD  for allowing me to participate in the care of your pleasant patient. Please do not hesitate to contact me with questions or concerns in the interim.  # DISPOSITION: # no labs today # Eligard - next week # Follow up in 2 months- MD; PRIOR-  2-3 days labs- cbc/cmp; Vit D 25-OH; PSA- Dr.B

## 2024-02-05 NOTE — Telephone Encounter (Signed)
 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  Prescription dose and frequency assessed.  Apalutamide  dosing for this indication is 240 mg PO once daily.   Current medication list in Epic reviewed, one DDI with apalutamide  identified: Rosuvastatin:apalutamide  - apalutamide  may decrease serum concentration of rosuvastatin. May need to increase rosuvastatin dose. Recommend patient keep up with annual PCP visits. No baseline dose adjustment needed.  Evaluated chart and no patient barriers to medication adherence identified.   Prescription has been e-scribed to the [facility] for benefits analysis and approval.  Oral Oncology Clinic will continue to follow for insurance authorization, copayment issues, initial counseling and start date.   Signe JINNY Platt, PharmD Candidate 2026  [facility] Oral Chemotherapy Navigation Clinic [phone]  02/05/2024 2:22 PM

## 2024-02-05 NOTE — Telephone Encounter (Signed)
 Oral Oncology Patient Advocate Encounter  Prior Authorization for Erleada  has been approved.    PA# [id number]  Effective dates: 02/05/2024 through 02/04/2025  Patients co-pay is $1,954.51.   Putting patient on wait list for grant.  Kolby Myung ([name]) [name], CPhT  [facility], [name], [name] - Oral Chemotherapy Patient Advocate Specialist III Phone: [phone]  Fax: [phone]

## 2024-02-05 NOTE — Telephone Encounter (Signed)
 Oral Oncology Patient Advocate Encounter   New authorization   Received notification that prior authorization for Erleada  is required.   PA submitted on CMM via Latent Key [id number]  Status is pending     Star Cheese ([name]) [name], CPhT  [facility], [name], [name] - Oral Chemotherapy Patient Advocate Specialist III Phone: [phone]  Fax: [phone]

## 2024-02-05 NOTE — Progress Notes (Signed)
 [facility] OFFICE PROGRESS NOTE  Patient Care Team: [name], MD as PCP - General (Internal Medicine) Roy Cindy SAUNDERS, MD as Consulting Physician (Oncology)   Cancer Staging  Prostate cancer [facility]) Staging form: Prostate, AJCC 8th Edition - Clinical: Stage IVA (rcTX, rcN1, rcM0) - Signed by Roy Cindy SAUNDERS, MD on 02/05/2024 Stage prefix: Recurrence    Oncology History Overview Note  IMPRESSION: 1. Focal activity in the RIGHT lobe of the prostate gland concerning for prostate adenocarcinoma recurrence. 2. Radiotracer avid distal RIGHT external iliac node and several RIGHT inguinal nodes consistent with nodal metastasis. 3. No evidence of visceral metastasis or skeletal metastasis.     Electronically Signed   By: Roy Gentry M.D.   On: 01/26/2024 15:5 Urologic history: T2b high risk prostate cancer Biopsy 10/2017; PSA 1.5; right apical nodule with induration Volume 44 g; Gleason 4+/4+5 adenocarcinoma IMRT + brachytherapy + ADT (24 months) Brachytherapy 02/2018    PSA trend   Prostate Specific Ag, Serum  Latest Ref Rng 0.0 - 4.0 ng/mL  08/11/2019 <0.1   08/16/2020 <0.1   03/11/2021 <0.1   08/13/2021 <0.1   08/15/2022 0.2   03/06/2023 0.4   06/19/2023 0.5   09/04/2023 0.8     # SEP MID- Eligard -   Prostate cancer (HCC)  02/26/2020 Initial Diagnosis   Prostate cancer (HCC)   02/05/2024 Cancer Staging   Staging form: Prostate, AJCC 8th Edition - Clinical: Stage IVA (rcTX, rcN1, rcM0) - Signed by Roy Cindy SAUNDERS, MD on 02/05/2024 Stage prefix: Recurrence      HISTORY OF PRESENT ILLNESS: Patient ambulating-independently.  Accompanied by wife.  Roy Gentry 73 y.o.  male pleasant patient above history of prostate cancer has been referred to us  by urology to discuss further evaluation/treatment.  Patient was incidentally diagnosed with prostate cancer in 2019.-Patient status post EBRT and ADT x 2 years.  Patient noted to have  hot flashes at that time.  Currently hot flashes resolved.  Patient currently notes to have intermittent discomfort in rectal and groin. Last colonoscopy in 2025.   Difficulty with urination, increased urgency and frequency with slow stream-worse most recently.  Started on 2 pills of Flomax  recently by urology.  Denies any worsening back pain bone pain.  Appetite good.  No weight loss.  Review of Systems  Constitutional:  Negative for chills, diaphoresis, fever, malaise/fatigue and weight loss.  HENT:  Negative for nosebleeds and sore throat.   Eyes:  Negative for double vision.  Respiratory:  Negative for cough, hemoptysis, sputum production, shortness of breath and wheezing.   Cardiovascular:  Negative for chest pain, palpitations, orthopnea and leg swelling.  Gastrointestinal:  Negative for abdominal pain, blood in stool, constipation, diarrhea, heartburn, melena, nausea and vomiting.  Genitourinary:  Positive for frequency and urgency. Negative for dysuria.  Musculoskeletal:  Negative for back pain and joint pain.  Skin: Negative.  Negative for itching and rash.  Neurological:  Negative for dizziness, tingling, focal weakness, weakness and headaches.  Endo/Heme/Allergies:  Does not bruise/bleed easily.  Psychiatric/Behavioral:  Negative for depression. The patient is not nervous/anxious and does not have insomnia.       PAST MEDICAL HISTORY :  Past Medical History:  Diagnosis Date   Asthma    Cancer (HCC)    Family history of adverse reaction to anesthesia    ponv    PAST SURGICAL HISTORY :   Past Surgical History:  Procedure Laterality Date   heel fracture Bilateral  RADIOACTIVE SEED IMPLANT N/A 03/09/2018   Procedure: RADIOACTIVE SEED IMPLANT/BRACHYTHERAPY IMPLANT;  Surgeon: Twylla Glendia BROCKS, MD;  Location: [facility] ORS;  Service: Urology;  Laterality: N/A;   TONSILECTOMY, ADENOIDECTOMY, BILATERAL MYRINGOTOMY AND TUBES      FAMILY HISTORY :   Family History  Problem  Relation Age of Onset   Healthy Mother    Lung cancer Father    Skin cancer Brother    Cancer Paternal Uncle    Prostate cancer Neg Hx    Bladder Cancer Neg Hx    Kidney cancer Neg Hx     SOCIAL HISTORY:   Social History   Tobacco Use   Smoking status: Former    Types: Cigarettes   Smokeless tobacco: Never  Vaping Use   Vaping status: Never Used  Substance Use Topics   Alcohol use: Yes    Alcohol/week: 8.0 standard drinks of alcohol    Types: 6 Cans of beer, 2 Shots of liquor per week   Drug use: Not Currently    ALLERGIES:  is allergic to simvastatin and donnatal [pb-hyoscy-atropine-scopolamine].  MEDICATIONS:  Current Outpatient Medications  Medication Sig Dispense Refill   calcium-vitamin D (OSCAL WITH D) 500-200 MG-UNIT tablet Take 1 tablet by mouth.     diphenhydrAMINE (BENADRYL) 25 MG tablet Take 25 mg by mouth daily as needed for allergies.     mometasone (ELOCON) 0.1 % lotion SMARTSIG:In Ear(s) Twice Daily PRN     Naproxen Sod-diphenhydrAMINE 220-25 MG TABS Take 1 tablet by mouth at bedtime as needed (sleep).      rosuvastatin (CRESTOR) 10 MG tablet Take 10 mg by mouth daily.     tamsulosin  (FLOMAX ) 0.4 MG CAPS capsule Take 1 capsule (0.4 mg total) by mouth daily. 30 capsule 3   No current facility-administered medications for this visit.    PHYSICAL EXAMINATION:   BP 123/77 (BP Location: Right Arm, Patient Position: Sitting, Cuff Size: Large)   Pulse 92   Temp (!) 96.7 F (35.9 C) (Tympanic)   Resp 16   Ht 6' (1.829 m)   Wt 217 lb 1.6 oz (98.5 kg)   SpO2 95%   BMI 29.44 kg/m   Filed Weights   02/05/24 1057  Weight: 217 lb 1.6 oz (98.5 kg)    Physical Exam Vitals and nursing note reviewed.  HENT:     Head: Normocephalic and atraumatic.     Mouth/Throat:     Pharynx: Oropharynx is clear.  Eyes:     Extraocular Movements: Extraocular movements intact.     Pupils: Pupils are equal, round, and reactive to light.  Cardiovascular:     Rate and  Rhythm: Normal rate and regular rhythm.  Pulmonary:     Comments: Decreased breath sounds bilaterally.  Abdominal:     Palpations: Abdomen is soft.  Musculoskeletal:        General: Normal range of motion.     Cervical back: Normal range of motion.  Skin:    General: Skin is warm.  Neurological:     General: No focal deficit present.     Mental Status: He is alert and oriented to person, place, and time.  Psychiatric:        Behavior: Behavior normal.        Judgment: Judgment normal.      LABORATORY DATA:  I have reviewed the data as listed    Component Value Date/Time   CREATININE 0.80 11/24/2017 1033    No results found for: SPEP, UPEP  Lab  Results  Component Value Date   WBC 4.3 03/03/2018   HGB 14.0 03/03/2018   HCT 42.1 03/03/2018   MCV 93.3 03/03/2018   PLT 240 03/03/2018      Chemistry      Component Value Date/Time   CREATININE 0.80 11/24/2017 1033   No results found for: CALCIUM, ALKPHOS, AST, ALT, BILITOT     RADIOGRAPHIC STUDIES: I have personally reviewed the radiological images as listed and agreed with the findings in the report. No results found.   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  Discussed that unfortunately patient's cancer cannot be cured.  All treatments are palliative and not curative; and treatments are in general indefinite [unless limited by toxicity]  Given the low volume disease I think is reasonable to hold off docetaxel chemotherapy.  For patients with low-volume, low-risk disease, we offer the combination of ADT with a novel hormonal agent [eg, enzalutamide, apalutamide , or abiraterone].  Recommend apalutamide  standard dosing.  Discussed the potential side  effects including but not limited to skin rash diarrhea etc.  Otherwise well-tolerated.  Discussed with pharmacy.  #I  discussed at length the importance of starting ADT-to treat his progressive prostate cancer.  I reviewed the mechanism of action of ADT/blocking testosterone .  Again reviewed the potential side effects including but not limited to-fatigue hot flashes loss of libido.  Also reviewed that bone health/cardiovascular as long-term complications.  I would recommend Eligard .   # Prostatism symptoms [Dr. Stoioff]-currently on Flomax  2 pills; defer to urology.  # Hx of ascending thoracic aortic aneurysm and abdominal aortic aneurysm [Dr.Schnier]- stable.  Thank you Dr.Stoioff MD  for allowing me to participate in the care of your pleasant patient. Please do not hesitate to contact me with questions or concerns in the interim.  # DISPOSITION: # no labs today # Eligard - next week # Follow up in 2 months- MD; PRIOR-  2-3 days labs- cbc/cmp; Vit D 25-OH; PSA- Dr.B   Orders Placed This Encounter  Procedures   CBC with Differential (Cancer Center Only)    Standing Status:   Future    Expected Date:   04/06/2024    Expiration Date:   02/04/2025   CMP (Cancer Center only)    Standing Status:   Future    Expected Date:   04/06/2024    Expiration Date:   02/04/2025   VITAMIN D 25 Hydroxy (Vit-D Deficiency, Fractures)    Standing Status:   Future    Expected Date:   04/06/2024    Expiration Date:   02/04/2025   PSA    Standing Status:   Future    Expected Date:   04/06/2024    Expiration Date:   02/04/2025   All questions were answered. The patient knows to call the clinic with any problems, questions or concerns.      Cindy JONELLE Kyden, MD 02/05/2024 2:19 PM

## 2024-02-10 ENCOUNTER — Telehealth: Payer: Self-pay | Admitting: Pharmacy Technician

## 2024-02-10 ENCOUNTER — Other Ambulatory Visit (HOSPITAL_COMMUNITY): Payer: Self-pay

## 2024-02-10 NOTE — Telephone Encounter (Signed)
 Oral Oncology Patient Advocate Encounter  Patient called stating he will bring in tax info and will also sign application tomorrow, 09/18, since he will be here for an appointment.  Provider portion of application has been placed on Dr. Damaris desk for him to sign.  Kennidy Lamke ([name]) [name], CPhT  [facility], [name], [name] - Oral Chemotherapy Patient Advocate Specialist III Phone: [phone]  Fax: [phone]

## 2024-02-10 NOTE — Telephone Encounter (Signed)
 Oral Oncology Patient Advocate Encounter   Began application for assistance for Erleada  through [facility].   Application will be submitted upon completion of necessary supporting documentation.   [employer] PAP phone number [phone].  [employer] Fax number [phone].  Waiting on MD signature and patient income/signatures.  Chardai Gangemi ([name]) [name], CPhT  [facility], [name], [name] - Oral Chemotherapy Patient Advocate Specialist III Phone: [phone]  Fax: [phone]

## 2024-02-11 ENCOUNTER — Other Ambulatory Visit (HOSPITAL_COMMUNITY): Payer: Self-pay

## 2024-02-11 ENCOUNTER — Inpatient Hospital Stay

## 2024-02-11 ENCOUNTER — Ambulatory Visit: Admitting: Pharmacist

## 2024-02-11 VITALS — BP 135/77 | HR 57 | Temp 96.7°F | Resp 18 | Wt 219.5 lb

## 2024-02-11 DIAGNOSIS — C61 Malignant neoplasm of prostate: Secondary | ICD-10-CM | POA: Diagnosis not present

## 2024-02-11 MED ORDER — LEUPROLIDE ACETATE (6 MONTH) 45 MG ~~LOC~~ KIT
45.0000 mg | PACK | Freq: Once | SUBCUTANEOUS | Status: AC
  Administered 2024-02-11: 45 mg via SUBCUTANEOUS
  Filled 2024-02-11: qty 45

## 2024-02-11 NOTE — Telephone Encounter (Signed)
 Oral Oncology Patient Advocate Encounter   Submitted application for assistance for Erleada  to [facility] Patient Assistance Program.   Application submitted via e-fax to [phone]   [facility] Patient Assistance Program phone number [phone].   I will continue to check the status until final determination.   Lauralye Kinn ([name]) [name], CPhT  [facility], [name], [name] - Oral Chemotherapy Patient Advocate Specialist III Phone: [phone]  Fax: [phone]

## 2024-02-11 NOTE — Progress Notes (Signed)
 Clinical Pharmacist Practitioner [facility]  [phone] [phone]  Patient Care Team: [name], MD as PCP - General (Internal Medicine) [name], MD as Consulting Physician (Oncology)   Name of the patient: Roy Gentry  [id number]  1950-12-26   Date of visit: 02/11/24   HPI: Patient is a 73 y.o. 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 Planned treatment with ADT and apalutamide .   Reason for Consult: Apalutamide  oral chemotherapy education.   PAST MEDICAL HISTORY: Past Medical History:  Diagnosis Date   Asthma    Cancer (HCC)    Family history of adverse reaction to anesthesia    ponv    HEMATOLOGY/ONCOLOGY HISTORY:  Oncology History Overview Note  IMPRESSION: 1. Focal activity in the RIGHT lobe of the prostate gland concerning for prostate adenocarcinoma recurrence. 2. Radiotracer avid distal RIGHT external iliac node and several RIGHT inguinal nodes consistent with nodal metastasis. 3. No evidence of visceral metastasis or skeletal metastasis.     Electronically Signed   By: [name]   On: 01/26/2024 15:5 Urologic history: T2b high risk prostate cancer Biopsy 10/2017; PSA 1.5; right apical nodule with induration Volume 44 g; Gleason 4+/4+5 adenocarcinoma IMRT + brachytherapy + ADT (24 months) Brachytherapy 02/2018    PSA trend   Prostate Specific Ag, Serum  Latest Ref Rng 0.0 - 4.0 ng/mL  08/11/2019 <0.1   08/16/2020 <0.1   03/11/2021 <0.1   08/13/2021 <0.1   08/15/2022 0.2   03/06/2023 0.4   06/19/2023 0.5   09/04/2023 0.8     # SEP MID- Eligard -   Prostate cancer (HCC)  02/26/2020 Initial Diagnosis   Prostate cancer (HCC)   02/05/2024 Cancer Staging   Staging form: Prostate, AJCC 8th Edition - Clinical: Stage IVA (rcTX, rcN1, rcM0) - Signed by [name], MD on 02/05/2024 Stage prefix: Recurrence     ALLERGIES:  is  allergic to simvastatin and donnatal [pb-hyoscy-atropine-scopolamine].  MEDICATIONS:  Current Outpatient Medications  Medication Sig Dispense Refill   apalutamide  (ERLEADA ) 60 MG tablet Take 4 tablets (240 mg total) by mouth daily. 120 tablet 2   calcium-vitamin D (OSCAL WITH D) 500-200 MG-UNIT tablet Take 1 tablet by mouth.     diphenhydrAMINE (BENADRYL) 25 MG tablet Take 25 mg by mouth daily as needed for allergies.     mometasone (ELOCON) 0.1 % lotion SMARTSIG:In Ear(s) Twice Daily PRN     Naproxen Sod-diphenhydrAMINE 220-25 MG TABS Take 1 tablet by mouth at bedtime as needed (sleep).      rosuvastatin (CRESTOR) 10 MG tablet Take 10 mg by mouth daily.     tamsulosin  (FLOMAX ) 0.4 MG CAPS capsule Take 1 capsule (0.4 mg total) by mouth daily. 30 capsule 3   No current facility-administered medications for this visit.    VITAL SIGNS: BP 135/77   Pulse (!) 57   Temp (!) 96.7 F (35.9 C)   Resp 18   Wt 99.6 kg (219 lb 8 oz)   BMI 29.77 kg/m  Filed Weights   02/11/24 1025  Weight: 99.6 kg (219 lb 8 oz)    Estimated body mass index is 29.77 kg/m as calculated from the following:   Height as of 02/05/24: 6' (1.829 m).   Weight as of this encounter: 99.6 kg (219 lb 8 oz).  LABS: CBC:    Component Value Date/Time   WBC 4.3 03/03/2018 0945   HGB 14.0 03/03/2018 0945   HCT 42.1 03/03/2018  0945   PLT 240 03/03/2018 0945   MCV 93.3 03/03/2018 0945   Comprehensive Metabolic Panel:    Component Value Date/Time   CREATININE 0.80 11/24/2017 1033     Present during today's visit: Patient only  Start plan: pending manufacturer assistance approval, patient will start once he has access to medication   Patient Education I spoke with patient for overview of new oral chemotherapy medication: apalutamide    Treatment goal: Palliative  Administration: Counseled patient on administration, dosing, side effects, monitoring, drug-food interactions, safe handling, storage, and  disposal. Patient will take Take 4 tablets (240 mg total) by mouth daily .  Side Effects: Side effects include but not limited to: fatigue, decreased hemoglobin, decreased WBCs, electrolyte abnormalities, rash, photosensitivity.   Patient knows to call the office if rash occurs   Drug-drug Interactions (DDI): Rosuvastatin:apalutamide  - apalutamide  may decrease serum concentration of rosuvastatin. May need to increase rosuvastatin dose. Recommended patient keep up with annual PCP visits. No baseline dose adjustment needed.  Adherence: After discussion with patient no patient barriers to medication adherence identified.  Reviewed with patient importance of keeping a medication schedule and plan for any missed doses.  Distress evaluation: Distress thermometer completed during in person visit and reviewed with patient. Due to score, social work referral has not been sent.   Communication and Learning Assessment Primary learner: Patient Barriers to learning: No barriers Preferred language: English Learning preferences: Listening Reading  Mr. Dombrosky voiced understanding and appreciation. All questions answered. Medication handout provided.  Provided patient with Oral Chemotherapy Navigation Clinic phone number. Patient knows to call the office with questions or concerns. Oral Chemotherapy Navigation Clinic will continue to follow.  Patient expressed understanding and was in agreement with this plan. He also understands that He can call clinic at any time with any questions, concerns, or complaints.   Medication Access Issues: Pending manufacturer assistance approval   Follow-up plan: RTC as scheduled  Thank you for allowing me to participate in the care of this patient.   Time Total: 15 minutes   Visit consisted of counseling and education on dealing with issues of symptom management in the setting of serious and potentially life-threatening illness.Greater than 50%  of this time  was spent counseling and coordinating care related to the above assessment and plan.  Signed by: [name], PharmD, [name], CPP Hematology/Oncology Clinical Pharmacist Practitioner [facility] [phone]  02/11/2024 11:02 AM

## 2024-02-11 NOTE — Telephone Encounter (Signed)
 Hi, we received this   It is in media under  ERLEADA  ASSISTANCE ADDITIONAL INFO NEEDED

## 2024-02-11 NOTE — Telephone Encounter (Signed)
 Oral Oncology Patient Advocate Encounter  I called [employer] to ask about the fax that was indexed by Corean Maiden, per [employer] representative I can disregard the fax, it was sent in error.  [employer] representative stated the application is under review and I should receive an update in 2-3 business days.  I will continue to check the status until final determination.   Leinaala Catanese ([name]) [name], CPhT  [facility], [name], [name] - Oral Chemotherapy Patient Advocate Specialist III Phone: [phone]  Fax: [phone]

## 2024-02-15 NOTE — Telephone Encounter (Signed)
 Oral Oncology Patient Advocate Encounter  I called the [facility] Patient Assistance Program to ask for an update and the representative stated that the patient must apply for LIS before being considered for their program.  I called the patient to ask that he do this and he stated he would try to complete the application virtually sometime today.  The patient knows to call me once he receives a response back from his LIS application or if he happens to have any questions or concerns.  Zellie Jenning ([name]) [name], CPhT  [facility], [name], [name] - Oral Chemotherapy Patient Advocate Specialist III Phone: [phone]  Fax: [phone]

## 2024-02-20 ENCOUNTER — Other Ambulatory Visit: Payer: Self-pay | Admitting: Urology

## 2024-02-23 NOTE — Telephone Encounter (Signed)
 FYI Dr. KATHEE, Odetta is still working with the patient on mfg assistance for his [place] There are extra hoops to jump through with this assistance program, but Odetta is still actively working on get him medication.

## 2024-02-24 ENCOUNTER — Inpatient Hospital Stay: Attending: Internal Medicine | Admitting: Hospice and Palliative Medicine

## 2024-02-24 DIAGNOSIS — C61 Malignant neoplasm of prostate: Secondary | ICD-10-CM

## 2024-02-24 NOTE — Progress Notes (Signed)
 Multidisciplinary Oncology Council Documentation  Roy Gentry was presented by our [facility] on 02/24/2024, which included representatives from:  Palliative Care Dietitian  Physical/Occupational Therapist Nurse Navigator Genetics Social work Survivorship RN Financial Navigator Research RN   Roy Gentry currently presents with history of prostate cancer  We reviewed previous medical and familial history, history of present illness, and recent lab results along with all available histopathologic and imaging studies. The MOC considered available treatment options and made the following recommendations/referrals:  SW  The MOC is a meeting of clinicians from various specialty areas who evaluate and discuss patients for whom a multidisciplinary approach is being considered. Final determinations in the plan of care are those of the provider(s).   Today's extended care, comprehensive team conference, Roy Gentry was not present for the discussion and was not examined.

## 2024-02-25 ENCOUNTER — Telehealth: Payer: Self-pay

## 2024-02-25 ENCOUNTER — Inpatient Hospital Stay

## 2024-02-25 ENCOUNTER — Other Ambulatory Visit (HOSPITAL_COMMUNITY): Payer: Self-pay

## 2024-02-25 NOTE — Progress Notes (Signed)
 [facility] Clinical Social Work  Clinical Social Work was referred by [facility] for assessment of psychosocial needs.  Clinical Social Work Intern contacted patient by phone to offer support and assess for needs.  Pt reports that he is concerned about delay in coverage for his medication, which is Pending manufacturer assistance approval. Pt inquired whether the delay in treatment will have negative consequences to his health, which CSW Intern could not answer, however Intern reached out to Pharmacist working on coverage who will call pt today to discuss.  Pt reports no additional material or emotional concerns at this time.   Interventions: Intern informed patient of the support team roles and support services at [facility] and provided CSW contact information and encouraged him to call with any questions or concerns. Intern also contacted Pharmacist to follow up with pt re medication concerns.      Follow Up Plan:  CSW will follow-up with patient by phone . Pt has direct contact info for CSW Intern if any issues arise in the meantime.    Thersia KATHEE Daring  Clinical Social Work Intern [facility]

## 2024-02-25 NOTE — Telephone Encounter (Signed)
 Pt called in with increasing symptoms of frequency and urgency. He has gotten to where his stream in weak and dripping. Pt would like to know what the next step is because after increasing to flomax  twice a day he has not seen an improvement. He would like to know what the next step is. Please advise

## 2024-02-25 NOTE — Telephone Encounter (Signed)
 Oral Oncology Patient Advocate Encounter  The patient has confirmed with social services that the LIS denial letter is on the way to him.  Patient knows to call me/email me when he has received the letter.  Cailynn Bodnar ([name]) [name], CPhT  [facility], [name], [name] - Oral Chemotherapy Patient Advocate Specialist III Phone: [phone]  Fax: [phone]

## 2024-02-26 ENCOUNTER — Telehealth: Payer: Self-pay

## 2024-02-26 NOTE — Telephone Encounter (Signed)
Pt scheduled, pt aware of appt.

## 2024-02-26 NOTE — Telephone Encounter (Signed)
 Schedule PA visit for UA and PVR

## 2024-02-26 NOTE — Telephone Encounter (Signed)
 This encounter was created in error - please disregard.

## 2024-02-26 NOTE — Telephone Encounter (Signed)
 Left VM for patient to give us  a call back to schedule an appointment with a PA for a UA PVR to discuss his worsening urinary symptoms.

## 2024-03-01 NOTE — Progress Notes (Unsigned)
 03/02/2024 9:29 AM   Roy Gentry 1950/10/31 [id number]  Referring provider: [name], MD [address] [facility] [address] [facility] [place],  [place] [address]  Urological history: 1. 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   Radiotracer avid distal RIGHT external iliac node and several RIGHT inguinal nodes consistent with nodal metastasis.  2. LU TS - cysto (10/2023) -tight bladder neck, and a tight prostatic urethra consistent with radiation change with moderate trabeculation - failed silodosin  8 mg daily  - Tamsulosin  0.8 mg daily  No chief complaint on file.  HPI: Roy Gentry is a 73 y.o. man who presents today for worsening of his urinary symptoms.  Previous records reviewed.  He has history of high risk prostate cancer with nodal metastases, currently followed by oncology.  I PSS ***  He reports sensation of incomplete bladder emptying, urinary frequency, urinary intermittency, urinary urgency, a weak urinary stream, having to strain to void, nocturia x ***, leaking before being able to reach the restroom, leaking with coughing, leaking without awareness, and post void dribbling.     He is wearing *** pads//depends  daily.    Patient denies any modifying or aggravating factors.  Patient denies any recent UTI's, gross hematuria, dysuria or suprapubic/flank pain.  Patient denies any fevers, chills, nausea or vomiting.  ***  He has a family history of PCa, colon cancer, ovarian cancer and/or breast cancer with ***.   He does not have a family history of PCa, colon cancer, ovarian cancer, and/or breast cancer .***     UA***  PVR***  PSA  (12/2023) 1.0   Testosterone  (08/2023) 207   Serum creatinine (06/2023) 0.7, eGFR 99  Fluid consumptiom: ***  PMH: Past Medical History:  Diagnosis Date   Asthma    Cancer (HCC)    Family history of adverse reaction to anesthesia    ponv    Surgical History: Past Surgical History:  Procedure Laterality Date   heel fracture Bilateral    RADIOACTIVE SEED IMPLANT N/A 03/09/2018   Procedure: RADIOACTIVE SEED IMPLANT/BRACHYTHERAPY IMPLANT;  Surgeon: Twylla Glendia BROCKS, MD;  Location: [facility] ORS;  Service: Urology;  Laterality: N/A;   TONSILECTOMY, ADENOIDECTOMY, BILATERAL MYRINGOTOMY AND TUBES      Home Medications:  Allergies as of 03/02/2024       Reactions   Simvastatin Other (See Comments)   Joint pain   Donnatal [pb-hyoscy-atropine-scopolamine] Rash        Medication List        Accurate as of March 01, 2024  9:29 AM. If you have any questions, ask your nurse or doctor.          apalutamide  60 MG tablet Commonly known as: ERLEADA  Take 4 tablets (240 mg total) by mouth daily.   calcium-vitamin D 500-200 MG-UNIT tablet Commonly known as: OSCAL WITH D Take 1 tablet by mouth.   diphenhydrAMINE 25 MG tablet Commonly known as: BENADRYL Take 25 mg by mouth daily as needed for allergies.   mometasone 0.1 % lotion Commonly known as: ELOCON SMARTSIG:In Ear(s) Twice Daily PRN   Naproxen Sod-diphenhydrAMINE 220-25 MG Tabs Take 1 tablet by mouth at bedtime as needed (sleep).  rosuvastatin 10 MG tablet Commonly known as: CRESTOR Take 10 mg by mouth daily.   tamsulosin  0.4 MG Caps capsule Commonly known as: FLOMAX  Take 2 capsules (0.8 mg total) by mouth daily.        Allergies:  Allergies  Allergen Reactions   Simvastatin Other (See Comments)    Joint pain   Donnatal [Pb-Hyoscy-Atropine-Scopolamine] Rash    Family History: Family History  Problem Relation Age of Onset   Healthy Mother    Lung cancer Father    Skin cancer Brother    Cancer  Paternal Uncle    Prostate cancer Neg Hx    Bladder Cancer Neg Hx    Kidney cancer Neg Hx     Social History:  reports that he has quit smoking. His smoking use included cigarettes. He has never used smokeless tobacco. He reports current alcohol use of about 8.0 standard drinks of alcohol per week. He reports that he does not currently use drugs.  ROS: Pertinent ROS in HPI  Physical Exam: There were no vitals taken for this visit.  Constitutional:  Well nourished. Alert and oriented, No acute distress. HEENT: [place] AT, moist mucus membranes.  Trachea midline, no masses. Cardiovascular: No clubbing, cyanosis, or edema. Respiratory: Normal respiratory effort, no increased work of breathing. GI: Abdomen is soft, non tender, non distended, no abdominal masses. Liver and spleen not palpable.  No hernias appreciated.  Stool sample for occult testing is not indicated.   GU: No CVA tenderness.  No bladder fullness or masses.  Patient with circumcised/uncircumcised phallus. ***Foreskin easily retracted***  Urethral meatus is patent.  No penile discharge. No penile lesions or rashes. Scrotum without lesions, cysts, rashes and/or edema.  Testicles are located scrotally bilaterally. No masses are appreciated in the testicles. Left and right epididymis are normal. Rectal: Patient with  normal sphincter tone. Anus and perineum without scarring or rashes. No rectal masses are appreciated. Prostate is approximately *** grams, *** nodules are appreciated. Seminal vesicles are normal. Skin: No rashes, bruises or suspicious lesions. Lymph: No cervical or inguinal adenopathy. Neurologic: Grossly intact, no focal deficits, moving all 4 extremities. Psychiatric: Normal mood and affect.  Laboratory Data: See HPI and Epic I have reviewed the labs.  See HPI   Pertinent Imaging: ***  Assessment & Plan:  ***  1. LU TS - stable, improving, worsening mild, moderate severe symptoms *** - no signs of retention,  infection or malignancy *** - DRE benign *** - UA benign *** - PVR < 300 cc *** - most bothersome symptoms are *** - encouraged avoiding bladder irritants, fluid restriction before bedtime and timed voiding's - Initiate alpha-blocker (***), discussed side effects *** - Initiate 5 alpha reductase inhibitor (***), discussed side effects *** - Continue tamsulosin  0.4 mg daily, alfuzosin 10 mg daily, Rapaflo  8 mg daily, terazosin, doxazosin, Cialis 5 mg daily and finasteride 5 mg daily, dutasteride 0.5 mg daily***:refills given - Cannot tolerate medication or medication failure, schedule cystoscopy *** - educated on red flag symptoms: acute retention, gross hematuria, fever, severe pain - advised to call clinic or go to the ED if these occur - return to clinic in *** symptom re-evaluation ***  2. Prostate cancer - followed by oncology      No follow-ups on file.  These notes generated with voice recognition software. I apologize for typographical errors.  CLOTILDA HELON RIGGERS  [facility] [address]  [address] [place], [place] [address] [phone]

## 2024-03-02 ENCOUNTER — Ambulatory Visit (INDEPENDENT_AMBULATORY_CARE_PROVIDER_SITE_OTHER): Admitting: Urology

## 2024-03-02 ENCOUNTER — Encounter: Payer: Self-pay | Admitting: Urology

## 2024-03-02 VITALS — BP 141/61 | HR 79 | Ht 72.0 in | Wt 218.0 lb

## 2024-03-02 DIAGNOSIS — C61 Malignant neoplasm of prostate: Secondary | ICD-10-CM | POA: Diagnosis not present

## 2024-03-02 DIAGNOSIS — R399 Unspecified symptoms and signs involving the genitourinary system: Secondary | ICD-10-CM | POA: Diagnosis not present

## 2024-03-02 LAB — URINALYSIS, COMPLETE
Bilirubin, UA: NEGATIVE
Glucose, UA: NEGATIVE
Ketones, UA: NEGATIVE
Leukocytes,UA: NEGATIVE
Nitrite, UA: NEGATIVE
Protein,UA: NEGATIVE
RBC, UA: NEGATIVE
Specific Gravity, UA: 1.025 (ref 1.005–1.030)
Urobilinogen, Ur: 0.2 mg/dL (ref 0.2–1.0)
pH, UA: 6 (ref 5.0–7.5)

## 2024-03-02 LAB — MICROSCOPIC EXAMINATION: Bacteria, UA: NONE SEEN

## 2024-03-02 LAB — BLADDER SCAN: Scan Result: 195

## 2024-03-02 MED ORDER — TAMSULOSIN HCL 0.4 MG PO CAPS: 0.8000 mg | ORAL_CAPSULE | Freq: Every day | ORAL | 3 refills | Status: AC

## 2024-03-03 ENCOUNTER — Encounter: Payer: Self-pay | Admitting: Internal Medicine

## 2024-03-03 ENCOUNTER — Telehealth: Payer: Self-pay | Admitting: Pharmacy Technician

## 2024-03-03 NOTE — Telephone Encounter (Signed)
 Oral Oncology Patient Advocate Encounter  Called the patient to check on the status of the LIS determination letter.   The patient stated that the only thing he received in the mail was a letter stating that he may be denied and that he had 10 days to contest that decision. The patient stated he does not wish to do that.  The patient stated that when he applied it gave him a denial due to his household income and assets being well above the limit. I advised the patient to call the social security administration and request for the denial letter to be sent before the 10 days to not delay us  in sending his PAP application.  The patient knows to call me/email me when has received the official denial letter.  Zahir Eisenhour ([name]) [name], CPhT  [facility], [name], [name] - Oral Chemotherapy Patient Advocate Specialist III Phone: [phone]  Fax: [phone]

## 2024-03-03 NOTE — Telephone Encounter (Addendum)
 Oral Oncology Patient Advocate Encounter  [name] while waiting for PAP requirements. Please see new encounter by me for further details.  Roy Gentry ([name]) [name], CPhT  [facility], [name], [name] - Oral Chemotherapy Patient Advocate Specialist III Phone: [phone]  Fax: [phone]

## 2024-03-03 NOTE — Telephone Encounter (Signed)
 Oral Oncology Patient Advocate Encounter   Was successful in securing patient a $3500 grant from [employer] (PAF) to provide copayment coverage for Erleada .  This will keep the out of pocket expense at $0.     The billing information is as follows and has been shared with [facility].   RxBin: [id number] PCN:  [id number] Member ID: [id number] Group ID: [id number] Dates of Eligibility: 09/05/2023 through 03/03/2025    Roy Gentry, CPhT  [facility], [name], [name] - Oral Chemotherapy Patient Advocate Specialist III Phone: [phone]  Fax: [phone]

## 2024-03-04 ENCOUNTER — Other Ambulatory Visit: Payer: Self-pay

## 2024-03-04 ENCOUNTER — Telehealth: Payer: Self-pay | Admitting: Pharmacy Technician

## 2024-03-04 ENCOUNTER — Other Ambulatory Visit: Payer: Self-pay | Admitting: Pharmacy Technician

## 2024-03-04 ENCOUNTER — Encounter: Payer: Self-pay | Admitting: Internal Medicine

## 2024-03-04 ENCOUNTER — Other Ambulatory Visit (HOSPITAL_COMMUNITY): Payer: Self-pay

## 2024-03-04 NOTE — Progress Notes (Signed)
 Specialty Pharmacy Initial Fill Coordination Note  Roy Gentry is a 73 y.o. male contacted today regarding refills of specialty medication(s) Apalutamide  (ERLEADA ) .  Patient requested Delivery  on 03/08/24  to verified address [address] [place] [address]   Medication will be filled on 10/13.   Patient is aware of $0 copayment. Leisure centre manager.  [name] [name], CPhT  [facility], [name], [name] - Oral Chemotherapy Patient Advocate Specialist III Phone: [phone]  Fax: [phone]

## 2024-03-04 NOTE — Telephone Encounter (Signed)
 Oral Oncology Patient Advocate Encounter  Patient successfully OnBoarded and drug education provided by pharmacist. Medication scheduled to be shipped on 10/13 for delivery on 10/14 from [facility] to patient's address. Patient also knows to call me at [phone] with any questions or concerns regarding receiving medication or if there is any unexpected change in co-pay.    Roy Gentry ([name]) [name], CPhT  [facility], [name], [name] - Oral Chemotherapy Patient Advocate Specialist III Phone: [phone]  Fax: [phone]

## 2024-03-04 NOTE — Progress Notes (Signed)
 Oral Chemotherapy Pharmacist Encounter  Patient was counseled under clinic encounter from 02/24/24. Patient will start Erleada  on 03/09/24.  Roy Gentry, PharmD, BCPS, BCOP Hematology/Oncology Clinical Pharmacist [facility] Oral Chemotherapy Navigation Clinics [phone] 03/04/2024 1:01 PM

## 2024-03-07 ENCOUNTER — Other Ambulatory Visit: Payer: Self-pay

## 2024-03-08 ENCOUNTER — Ambulatory Visit: Payer: Self-pay | Admitting: Urology

## 2024-03-08 LAB — CULTURE, URINE COMPREHENSIVE

## 2024-03-09 NOTE — Progress Notes (Signed)
 Called patient and understood

## 2024-03-11 ENCOUNTER — Other Ambulatory Visit

## 2024-03-24 ENCOUNTER — Other Ambulatory Visit: Payer: Self-pay

## 2024-03-30 ENCOUNTER — Other Ambulatory Visit: Payer: Self-pay

## 2024-03-30 NOTE — Progress Notes (Signed)
 Specialty Pharmacy Refill Coordination Note  Roy Gentry is a 73 y.o. male contacted today regarding refills of specialty medication(s) Apalutamide  (ERLEADA )   Patient requested Delivery   Delivery date: 04/04/24   Verified address: [address] [place] [address]   Medication will be filled on: 04/01/24

## 2024-03-30 NOTE — Progress Notes (Signed)
 Specialty Pharmacy Ongoing Clinical Assessment Note  Roy Gentry is a 73 y.o. male who is being followed by the specialty pharmacy service for RxSp Oncology   Patient's specialty medication(s) reviewed today: Apalutamide  (ERLEADA )   Missed doses in the last 4 weeks: 0   Patient/Caregiver did not have any additional questions or concerns.   Therapeutic benefit summary: Unable to assess   Adverse events/side effects summary: No adverse events/side effects   Patient's therapy is appropriate to: Continue    Goals Addressed             This Visit's Progress    Slow Disease Progression       Patient is on track. Patient will maintain adherence         Follow up: 3 months  [facility] Specialty Pharmacist

## 2024-03-31 ENCOUNTER — Other Ambulatory Visit: Payer: Self-pay

## 2024-04-01 ENCOUNTER — Other Ambulatory Visit: Payer: Self-pay

## 2024-04-01 ENCOUNTER — Inpatient Hospital Stay: Attending: Internal Medicine

## 2024-04-01 DIAGNOSIS — C61 Malignant neoplasm of prostate: Secondary | ICD-10-CM

## 2024-04-01 DIAGNOSIS — E785 Hyperlipidemia, unspecified: Secondary | ICD-10-CM

## 2024-04-01 LAB — LIPID PANEL
Cholesterol: 162 mg/dL (ref 0–200)
HDL: 54 mg/dL (ref >40)
LDL Cholesterol: 85 mg/dL (ref 0–99)
Total CHOL/HDL Ratio: 3 ratio
Triglycerides: 117 mg/dL (ref <150)
VLDL: 23 mg/dL (ref 0–40)

## 2024-04-01 LAB — CBC WITH DIFFERENTIAL (CANCER CENTER ONLY)
Abs Immature Granulocytes: 0.03 K/uL (ref 0.00–0.07)
Basophils Absolute: 0 K/uL (ref 0.0–0.1)
Basophils Relative: 1 %
Eosinophils Absolute: 0.2 K/uL (ref 0.0–0.5)
Eosinophils Relative: 4 %
HCT: 40.1 % (ref 39.0–52.0)
Hemoglobin: 13.6 g/dL (ref 13.0–17.0)
Immature Granulocytes: 1 %
Lymphocytes Relative: 19 %
Lymphs Abs: 0.9 K/uL (ref 0.7–4.0)
MCH: 30.7 pg (ref 26.0–34.0)
MCHC: 33.9 g/dL (ref 30.0–36.0)
MCV: 90.5 fL (ref 80.0–100.0)
Monocytes Absolute: 0.5 K/uL (ref 0.1–1.0)
Monocytes Relative: 11 %
Neutro Abs: 3.3 K/uL (ref 1.7–7.7)
Neutrophils Relative %: 64 %
Platelet Count: 251 K/uL (ref 150–400)
RBC: 4.43 MIL/uL (ref 4.22–5.81)
RDW: 12.5 % (ref 11.5–15.5)
WBC Count: 5 K/uL (ref 4.0–10.5)
nRBC: 0 % (ref 0.0–0.2)

## 2024-04-01 LAB — CMP (CANCER CENTER ONLY)
ALT: 21 U/L (ref 0–44)
AST: 22 U/L (ref 15–41)
Albumin: 3.6 g/dL (ref 3.5–5.0)
Alkaline Phosphatase: 71 U/L (ref 38–126)
Anion gap: 9 (ref 5–15)
BUN: 15 mg/dL (ref 8–23)
CO2: 23 mmol/L (ref 22–32)
Calcium: 8.8 mg/dL — ABNORMAL LOW (ref 8.9–10.3)
Chloride: 103 mmol/L (ref 98–111)
Creatinine: 0.89 mg/dL (ref 0.61–1.24)
GFR, Estimated: >60 mL/min (ref >60)
Glucose, Bld: 132 mg/dL — ABNORMAL HIGH (ref 70–99)
Potassium: 3.9 mmol/L (ref 3.5–5.1)
Sodium: 135 mmol/L (ref 135–145)
Total Bilirubin: 0.7 mg/dL (ref 0.0–1.2)
Total Protein: 6.4 g/dL — ABNORMAL LOW (ref 6.5–8.1)

## 2024-04-01 LAB — PSA: Prostatic Specific Antigen: 0.05 ng/mL (ref 0.00–4.00)

## 2024-04-01 LAB — VITAMIN D 25 HYDROXY (VIT D DEFICIENCY, FRACTURES): Vit D, 25-Hydroxy: 44.98 ng/mL (ref 30–100)

## 2024-04-05 ENCOUNTER — Encounter: Payer: Self-pay | Admitting: Internal Medicine

## 2024-04-05 ENCOUNTER — Inpatient Hospital Stay (HOSPITAL_BASED_OUTPATIENT_CLINIC_OR_DEPARTMENT_OTHER): Admitting: Internal Medicine

## 2024-04-05 VITALS — BP 115/75 | HR 54 | Temp 96.2°F | Resp 16 | Ht 72.0 in | Wt 216.6 lb

## 2024-04-05 DIAGNOSIS — C61 Malignant neoplasm of prostate: Secondary | ICD-10-CM

## 2024-04-05 NOTE — Assessment & Plan Note (Addendum)
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 [Sep mid 2025]  # PSA improving- 0.05; recommend decrease the dose of Erleda to 3 pills a day sec to brain fog- [see below]  # Prostatism symptoms [Dr. Stoioff]-currently on Flomax  2 pills; defer to urolog re: straight cath/difficulty sleeping at night.  # Right hip pain: unlikly sec to cancer-recommend further workup with orthopedics/PCP.  # Hx of ascending thoracic aortic aneurysm and abdominal aortic aneurysm [Dr.Schnier]- stable.  # Depression/ Fatigue: Patient has no other identifiable causes of fatigue at this time.  I had a long discussion with the patient regarding the ability of structured exercise program help treat cancer related fatigue.  Introduced the care program available to cancer patients at [facility]. Patient is interested; however declines at this time- he will try [employer].   Eligard - 9/18  # DISPOSITION: # Follow up in 2 months- MD; PRIOR-  2-3 days labs- cbc/cmp; Vit D 25-OH; PSA- Dr.B

## 2024-04-05 NOTE — Progress Notes (Signed)
 Patient states an increase in urinary frequency and difficulty sleeping thorough the night. Complaints of increasing hot flashes as well.

## 2024-04-05 NOTE — Progress Notes (Signed)
 [facility] OFFICE PROGRESS NOTE  Patient Care Team: [name], MD as PCP - General (Internal Medicine) [name], MD as Consulting Physician (Oncology)   Cancer Staging  Prostate cancer [facility]) Staging form: Prostate, AJCC 8th Edition - Clinical: Stage IVA (rcTX, rcN1, rcM0) - Signed by [name], MD on 02/05/2024 Stage prefix: Recurrence    Oncology History Overview Note  IMPRESSION: 1. Focal activity in the RIGHT lobe of the prostate gland concerning for prostate adenocarcinoma recurrence. 2. Radiotracer avid distal RIGHT external iliac node and several RIGHT inguinal nodes consistent with nodal metastasis. 3. No evidence of visceral metastasis or skeletal metastasis.     Electronically Signed   By: [name]   On: 01/26/2024 15:5 Urologic history: T2b high risk prostate cancer Biopsy 10/2017; PSA 1.5; right apical nodule with induration Volume 44 g; Gleason 4+/4+5 adenocarcinoma IMRT + brachytherapy + ADT (24 months) Brachytherapy 02/2018    PSA trend   Prostate Specific Ag, Serum  Latest Ref Rng 0.0 - 4.0 ng/mL  08/11/2019 <0.1   08/16/2020 <0.1   03/11/2021 <0.1   08/13/2021 <0.1   08/15/2022 0.2   03/06/2023 0.4   06/19/2023 0.5   09/04/2023 0.8     # SEP MID- Eligard -   Prostate cancer (HCC)  02/26/2020 Initial Diagnosis   Prostate cancer (HCC)   02/05/2024 Cancer Staging   Staging form: Prostate, AJCC 8th Edition - Clinical: Stage IVA (rcTX, rcN1, rcM0) - Signed by [name], MD on 02/05/2024 Stage prefix: Recurrence      HISTORY OF PRESENT ILLNESS: Patient ambulating-independently.  Accompanied by wife.  Roy Gentry 73 y.o.  male pleasant patient above history of prostate cancer -low-volume prostate cancer castrate sensitive-on Erleada  plus Eligard  is here for follow-up.  Discussed the use of AI scribe software for clinical note transcription with the patient, who gave verbal consent  to proceed.  History of Present Illness   Roy Gentry is a 73 year old male with prostate cancer on ADT plus Erleada  who presents for follow-up. He is accompanied by his wife.  He experiences hot flashes and 'brain fog', described as difficulty concentrating, particularly while driving. He is uncertain if these symptoms are due to Erleada  or the increased dose of Flomax . No dizzy spells are reported.  He has nocturia, needing to urinate two to four times per night, disrupting his sleep. Flomax  initially helped but is now less effective. Standing during the day often triggers the urge to urinate. A urologist suggested radiation might have caused these symptoms, but significant issues arose only after cancer recurrence. A scan showed incomplete bladder emptying, and self-catheterization was advised. Frequent urination has led to a decline in social activities, impacting his lifestyle and causing him to avoid outings with family. He stays home more often to prevent restroom issues while out. His wife notes changes in his caution and behavior, possibly due to stress.  He reports hip pain that began a couple of months ago, especially when sitting in a recliner, requiring frequent repositioning to alleviate discomfort.  He is currently taking apalutamide  (Erleada ) and Flomax . His PSA levels have significantly decreased from 1.0 in August to 0.05.     Review of Systems  Constitutional:  Negative for chills, diaphoresis, fever, malaise/fatigue and weight loss.  HENT:  Negative for nosebleeds and sore throat.   Eyes:  Negative for double vision.  Respiratory:  Negative for cough, hemoptysis, sputum production, shortness of breath and wheezing.  Cardiovascular:  Negative for chest pain, palpitations, orthopnea and leg swelling.  Gastrointestinal:  Negative for abdominal pain, blood in stool, constipation, diarrhea, heartburn, melena, nausea and vomiting.  Genitourinary:  Positive for frequency and  urgency. Negative for dysuria.  Musculoskeletal:  Negative for back pain and joint pain.  Skin: Negative.  Negative for itching and rash.  Neurological:  Negative for dizziness, tingling, focal weakness, weakness and headaches.  Endo/Heme/Allergies:  Does not bruise/bleed easily.  Psychiatric/Behavioral:  Negative for depression. The patient is not nervous/anxious and does not have insomnia.       PAST MEDICAL HISTORY :  Past Medical History:  Diagnosis Date   Asthma    Cancer (HCC)    Family history of adverse reaction to anesthesia    ponv    PAST SURGICAL HISTORY :   Past Surgical History:  Procedure Laterality Date   heel fracture Bilateral    RADIOACTIVE SEED IMPLANT N/A 03/09/2018   Procedure: RADIOACTIVE SEED IMPLANT/BRACHYTHERAPY IMPLANT;  Surgeon: Twylla Glendia BROCKS, MD;  Location: [facility] ORS;  Service: Urology;  Laterality: N/A;   TONSILECTOMY, ADENOIDECTOMY, BILATERAL MYRINGOTOMY AND TUBES      FAMILY HISTORY :   Family History  Problem Relation Age of Onset   Healthy Mother    Lung cancer Father    Skin cancer Brother    Cancer Paternal Uncle    Prostate cancer Neg Hx    Bladder Cancer Neg Hx    Kidney cancer Neg Hx     SOCIAL HISTORY:   Social History   Tobacco Use   Smoking status: Former    Types: Cigarettes   Smokeless tobacco: Never  Vaping Use   Vaping status: Never Used  Substance Use Topics   Alcohol use: Yes    Alcohol/week: 8.0 standard drinks of alcohol    Types: 6 Cans of beer, 2 Shots of liquor per week   Drug use: Not Currently    ALLERGIES:  is allergic to simvastatin and donnatal [pb-hyoscy-atropine-scopolamine].  MEDICATIONS:  Current Outpatient Medications  Medication Sig Dispense Refill   apalutamide  (ERLEADA ) 60 MG tablet Take 4 tablets (240 mg total) by mouth daily. 120 tablet 2   calcium-vitamin D (OSCAL WITH D) 500-200 MG-UNIT tablet Take 1 tablet by mouth.     diphenhydrAMINE (BENADRYL) 25 MG tablet Take 25 mg by mouth  daily as needed for allergies.     doxylamine, Sleep, (UNISOM) 25 MG tablet Take 25 mg by mouth at bedtime as needed for sleep.     Naproxen Sod-diphenhydrAMINE 220-25 MG TABS Take 1 tablet by mouth at bedtime as needed (sleep).      rosuvastatin (CRESTOR) 10 MG tablet Take 10 mg by mouth daily.     tamsulosin  (FLOMAX ) 0.4 MG CAPS capsule Take 2 capsules (0.8 mg total) by mouth daily. 60 capsule 3   tamsulosin  (FLOMAX ) 0.4 MG CAPS capsule Take 2 capsules (0.8 mg total) by mouth daily. 180 capsule 3   No current facility-administered medications for this visit.    PHYSICAL EXAMINATION:   BP 115/75 (BP Location: Right Arm, Patient Position: Sitting)   Pulse (!) 54   Temp (!) 96.2 F (35.7 C) (Tympanic)   Resp 16   Ht 6' (1.829 m)   Wt 216 lb 9.6 oz (98.2 kg)   SpO2 97%   BMI 29.38 kg/m   Filed Weights   04/05/24 0953  Weight: 216 lb 9.6 oz (98.2 kg)    Physical Exam Vitals and nursing note reviewed.  HENT:  Head: Normocephalic and atraumatic.     Mouth/Throat:     Pharynx: Oropharynx is clear.  Eyes:     Extraocular Movements: Extraocular movements intact.     Pupils: Pupils are equal, round, and reactive to light.  Cardiovascular:     Rate and Rhythm: Normal rate and regular rhythm.  Pulmonary:     Comments: Decreased breath sounds bilaterally.  Abdominal:     Palpations: Abdomen is soft.  Musculoskeletal:        General: Normal range of motion.     Cervical back: Normal range of motion.  Skin:    General: Skin is warm.  Neurological:     General: No focal deficit present.     Mental Status: He is alert and oriented to person, place, and time.  Psychiatric:        Behavior: Behavior normal.        Judgment: Judgment normal.      LABORATORY DATA:  I have reviewed the data as listed    Component Value Date/Time   NA 135 04/01/2024 0904   K 3.9 04/01/2024 0904   CL 103 04/01/2024 0904   CO2 23 04/01/2024 0904   GLUCOSE 132 (H) 04/01/2024 0904   BUN  15 04/01/2024 0904   CREATININE 0.89 04/01/2024 0904   CALCIUM 8.8 (L) 04/01/2024 0904   PROT 6.4 (L) 04/01/2024 0904   ALBUMIN 3.6 04/01/2024 0904   AST 22 04/01/2024 0904   ALT 21 04/01/2024 0904   ALKPHOS 71 04/01/2024 0904   BILITOT 0.7 04/01/2024 0904   GFRNONAA >60 04/01/2024 0904    No results found for: SPEP, UPEP  Lab Results  Component Value Date   WBC 5.0 04/01/2024   NEUTROABS 3.3 04/01/2024   HGB 13.6 04/01/2024   HCT 40.1 04/01/2024   MCV 90.5 04/01/2024   PLT 251 04/01/2024      Chemistry      Component Value Date/Time   NA 135 04/01/2024 0904   K 3.9 04/01/2024 0904   CL 103 04/01/2024 0904   CO2 23 04/01/2024 0904   BUN 15 04/01/2024 0904   CREATININE 0.89 04/01/2024 0904      Component Value Date/Time   CALCIUM 8.8 (L) 04/01/2024 0904   ALKPHOS 71 04/01/2024 0904   AST 22 04/01/2024 0904   ALT 21 04/01/2024 0904   BILITOT 0.7 04/01/2024 0904       RADIOGRAPHIC STUDIES: I have personally reviewed the radiological images as listed and agreed with the findings in the report. No results found.   ASSESSMENT & PLAN:  Prostate cancer (HCC) # Stage IVa  [SEP 2025- PSMA PET scan: metastatic spread to regional/nonregional lymph nodes; no bone metastases] LOW VOLUME DISEASE CASTRATE SENSITIVE PROSTATE CANCER: on ADT + Erleda. [Sep mid 2025]  # PSA improving- 0.05; recommend decrease the dose of Erleda to 3 pills a day sec to brain fog- [see below]  # Prostatism symptoms [Dr. Stoioff]-currently on Flomax  2 pills; defer to urolog re: straight cath/difficulty sleeping at night.  # Right hip pain: unlikly sec to cancer-recommend further workup with orthopedics/PCP.  # Hx of ascending thoracic aortic aneurysm and abdominal aortic aneurysm [Dr.Schnier]- stable.  # Depression/ Fatigue: Patient has no other identifiable causes of fatigue at this time.  I had a long discussion with the patient regarding the ability of structured exercise program help  treat cancer related fatigue.  Introduced the care program available to cancer patients at [facility]. Patient is interested; however declines at this  time- he will try [employer].   Eligard - 9/18  # DISPOSITION: # Follow up in 2 months- MD; PRIOR-  2-3 days labs- cbc/cmp; Vit D 25-OH; PSA- Dr.B    Orders Placed This Encounter  Procedures   CBC with Differential (Cancer Center Only)    Standing Status:   Future    Expected Date:   06/05/2024    Expiration Date:   04/05/2025   CMP (Cancer Center only)    Standing Status:   Future    Expected Date:   06/05/2024    Expiration Date:   04/05/2025   Vitamin D 25 hydroxy    Standing Status:   Future    Expected Date:   06/05/2024    Expiration Date:   04/05/2025   PSA    Standing Status:   Future    Expected Date:   06/05/2024    Expiration Date:   04/05/2025   All questions were answered. The patient knows to call the clinic with any problems, questions or concerns.      Cindy JONELLE Ace, MD 04/05/2024 7:58 PM

## 2024-04-22 ENCOUNTER — Other Ambulatory Visit: Payer: Self-pay

## 2024-04-26 ENCOUNTER — Other Ambulatory Visit: Payer: Self-pay

## 2024-04-26 NOTE — Progress Notes (Signed)
 Specialty Pharmacy Refill Coordination Note  Roy Gentry is a 73 y.o. male contacted today regarding refills of specialty medication(s) Apalutamide  (ERLEADA )   Patient requested Delivery   Delivery date: 05/05/24   Verified address: [address] [place] [address]   Medication will be filled on: 05/04/24

## 2024-04-27 ENCOUNTER — Telehealth: Payer: Self-pay

## 2024-04-27 NOTE — Telephone Encounter (Signed)
 I spoke with Dr. B--recommend he hold Erleada  for 1 week, be evaluated by PCP or urgent care for possible flu test.

## 2024-04-27 NOTE — Telephone Encounter (Signed)
 Please advise:  Patient says he was told to report any fevers while taking Erleada , temperature of 101.0 last night that dropped to 99.5 after taking Tylenol .  Has not checked his temperature this morning but doesn't feel like he is running a fever.  3 days ago he started having a runny nose, dry non-productive dry cough, feels like may be getting a sore throat today.  Last night he was feeling chilled and achy so took temperature which was 101.0

## 2024-04-27 NOTE — Telephone Encounter (Signed)
 Patient notified and agrees with MD recommendation

## 2024-04-28 ENCOUNTER — Other Ambulatory Visit: Payer: Self-pay

## 2024-05-04 ENCOUNTER — Other Ambulatory Visit: Payer: Self-pay

## 2024-05-26 ENCOUNTER — Encounter: Payer: Self-pay | Admitting: Internal Medicine

## 2024-05-27 ENCOUNTER — Other Ambulatory Visit: Payer: Self-pay

## 2024-05-27 ENCOUNTER — Other Ambulatory Visit: Payer: Self-pay | Admitting: Internal Medicine

## 2024-05-27 DIAGNOSIS — C61 Malignant neoplasm of prostate: Secondary | ICD-10-CM

## 2024-05-30 ENCOUNTER — Encounter: Payer: Self-pay | Admitting: Internal Medicine

## 2024-05-30 ENCOUNTER — Other Ambulatory Visit: Payer: Self-pay

## 2024-05-30 MED ORDER — APALUTAMIDE 60 MG PO TABS
240.0000 mg | ORAL_TABLET | Freq: Every day | ORAL | 2 refills | Status: DC
  Filled 2024-05-30: qty 120, 30d supply, fill #0

## 2024-06-02 ENCOUNTER — Encounter: Payer: Self-pay | Admitting: Internal Medicine

## 2024-06-02 ENCOUNTER — Inpatient Hospital Stay: Payer: Self-pay | Attending: Internal Medicine

## 2024-06-02 DIAGNOSIS — C61 Malignant neoplasm of prostate: Secondary | ICD-10-CM

## 2024-06-02 LAB — CBC WITH DIFFERENTIAL (CANCER CENTER ONLY)
Abs Immature Granulocytes: 0.02 K/uL (ref 0.00–0.07)
Basophils Absolute: 0.1 K/uL (ref 0.0–0.1)
Basophils Relative: 1 %
Eosinophils Absolute: 0.3 K/uL (ref 0.0–0.5)
Eosinophils Relative: 5 %
HCT: 39.2 % (ref 39.0–52.0)
Hemoglobin: 13.3 g/dL (ref 13.0–17.0)
Immature Granulocytes: 0 %
Lymphocytes Relative: 20 %
Lymphs Abs: 1.1 K/uL (ref 0.7–4.0)
MCH: 30.9 pg (ref 26.0–34.0)
MCHC: 33.9 g/dL (ref 30.0–36.0)
MCV: 91 fL (ref 80.0–100.0)
Monocytes Absolute: 0.5 K/uL (ref 0.1–1.0)
Monocytes Relative: 10 %
Neutro Abs: 3.3 K/uL (ref 1.7–7.7)
Neutrophils Relative %: 64 %
Platelet Count: 252 K/uL (ref 150–400)
RBC: 4.31 MIL/uL (ref 4.22–5.81)
RDW: 12.5 % (ref 11.5–15.5)
WBC Count: 5.2 K/uL (ref 4.0–10.5)
nRBC: 0 % (ref 0.0–0.2)

## 2024-06-02 LAB — CMP (CANCER CENTER ONLY)
ALT: 14 U/L (ref 0–44)
AST: 15 U/L (ref 15–41)
Albumin: 4.1 g/dL (ref 3.5–5.0)
Alkaline Phosphatase: 77 U/L (ref 38–126)
Anion gap: 10 (ref 5–15)
BUN: 15 mg/dL (ref 8–23)
CO2: 25 mmol/L (ref 22–32)
Calcium: 9.4 mg/dL (ref 8.9–10.3)
Chloride: 103 mmol/L (ref 98–111)
Creatinine: 0.9 mg/dL (ref 0.61–1.24)
GFR, Estimated: >60 mL/min
Glucose, Bld: 122 mg/dL — ABNORMAL HIGH (ref 70–99)
Potassium: 4.4 mmol/L (ref 3.5–5.1)
Sodium: 138 mmol/L (ref 135–145)
Total Bilirubin: 0.2 mg/dL (ref 0.0–1.2)
Total Protein: 6.4 g/dL — ABNORMAL LOW (ref 6.5–8.1)

## 2024-06-02 LAB — VITAMIN D 25 HYDROXY (VIT D DEFICIENCY, FRACTURES): Vit D, 25-Hydroxy: 45.5 ng/mL (ref 30–100)

## 2024-06-02 LAB — PSA: Prostatic Specific Antigen: 0.02 ng/mL (ref 0.00–4.00)

## 2024-06-03 ENCOUNTER — Other Ambulatory Visit (HOSPITAL_COMMUNITY): Payer: Self-pay

## 2024-06-03 ENCOUNTER — Telehealth: Payer: Self-pay | Admitting: Pharmacy Technician

## 2024-06-03 NOTE — Telephone Encounter (Signed)
 Oral Oncology Patient Advocate Encounter   Began application for assistance for erleada  through [employer] Patient Assistance Program.   Application will be submitted upon completion of necessary supporting documentation.   [employer] phone number [phone]. [employer] fax number [phone].   Pending LIS denial letter. Pt stated he would bring the letter with him to his appointment on 06/06/2024.   Oleva Koo ([name]) [name], CPhT  [facility], [name], [name] - Oral Chemotherapy Patient Advocate Specialist III Phone: [phone]  Fax: [phone]

## 2024-06-06 ENCOUNTER — Inpatient Hospital Stay: Admitting: Internal Medicine

## 2024-06-06 ENCOUNTER — Encounter: Payer: Self-pay | Admitting: Internal Medicine

## 2024-06-06 VITALS — BP 125/83 | HR 59 | Temp 97.6°F | Resp 18 | Ht 72.0 in | Wt 217.4 lb

## 2024-06-06 DIAGNOSIS — C61 Malignant neoplasm of prostate: Secondary | ICD-10-CM

## 2024-06-06 NOTE — Progress Notes (Signed)
 [facility] OFFICE PROGRESS NOTE  Patient Care Team: [name], MD as PCP - General (Internal Medicine) [name], MD as Consulting Physician (Oncology)   Cancer Staging  Prostate cancer [facility]) Staging form: Prostate, AJCC 8th Edition - Clinical: Stage IVA (rcTX, rcN1, rcM0) - Signed by [name], MD on 02/05/2024 Stage prefix: Recurrence    Oncology History Overview Note  IMPRESSION: 1. Focal activity in the RIGHT lobe of the prostate gland concerning for prostate adenocarcinoma recurrence. 2. Radiotracer avid distal RIGHT external iliac node and several RIGHT inguinal nodes consistent with nodal metastasis. 3. No evidence of visceral metastasis or skeletal metastasis.     Electronically Signed   By: [name]   On: 01/26/2024 15:5 Urologic history: T2b high risk prostate cancer Biopsy 10/2017; PSA 1.5; right apical nodule with induration Volume 44 g; Gleason 4+/4+5 adenocarcinoma IMRT + brachytherapy + ADT (24 months) Brachytherapy 02/2018    PSA trend   Prostate Specific Ag, Serum  Latest Ref Rng 0.0 - 4.0 ng/mL  08/11/2019 <0.1   08/16/2020 <0.1   03/11/2021 <0.1   08/13/2021 <0.1   08/15/2022 0.2   03/06/2023 0.4   06/19/2023 0.5   09/04/2023 0.8     # SEP MID- Eligard -   Prostate cancer (HCC)  02/26/2020 Initial Diagnosis   Prostate cancer (HCC)   02/05/2024 Cancer Staging   Staging form: Prostate, AJCC 8th Edition - Clinical: Stage IVA (rcTX, rcN1, rcM0) - Signed by [name], MD on 02/05/2024 Stage prefix: Recurrence     HISTORY OF PRESENT ILLNESS: Patient ambulating-independently.  Accompanied by wife.  Roy Gentry 74 y.o.  male pleasant patient above history of prostate cancer -low-volume prostate cancer castrate sensitive-on Erleada  plus Eligard  is here for follow-up.  Discussed the use of AI scribe software for clinical note transcription with the patient, who gave verbal consent to  proceed.  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  He was diagnosed with prostate cancer in 2019 with metastasis to pelvic lymph nodes and initially treated with external beam radiation (24 sessions) and brachytherapy. He currently receives androgen deprivation therapy with Eligard  and androgen receptor inhibition with Erleada , now at a reduced dose of three pills daily due to prior cognitive impairment. He reports improvement in mental clarity since the dose reduction. He is coordinating grant assistance for continued access to Erleada .  He experiences persistent fatigue and tiredness, which have not improved with the Erleada  dose reduction. He prefers to remain at home but maintains daily activity through walks and yard work. He previously had hip pain, which resolved with use of a gel memory cushion; he currently denies pain. His cancer involves the pelvic lymph nodes and not the bones or hip joint.  Recent laboratory studies show normal calcium, vitamin D , and protein levels. PSA has decreased from 1.0 to 0.05 and 0.02. His last Eligard  injection was in September 2025, with the next dose due in approximately two months. He is aware of insurance requirements regarding injection timing and is coordinating with the clinic to ensure coverage.  He prefers to remain active at home and is reluctant to participate in structured physical therapy programs. He inquired about surgical intervention for lymph node metastasis.     Review of Systems  Constitutional:  Negative for chills, diaphoresis, fever, malaise/fatigue and  weight loss.  HENT:  Negative for nosebleeds and sore throat.   Eyes:  Negative for double vision.  Respiratory:  Negative for cough, hemoptysis, sputum production, shortness of breath and wheezing.   Cardiovascular:   Negative for chest pain, palpitations, orthopnea and leg swelling.  Gastrointestinal:  Negative for abdominal pain, blood in stool, constipation, diarrhea, heartburn, melena, nausea and vomiting.  Genitourinary:  Positive for frequency and urgency. Negative for dysuria.  Musculoskeletal:  Negative for back pain and joint pain.  Skin: Negative.  Negative for itching and rash.  Neurological:  Negative for dizziness, tingling, focal weakness, weakness and headaches.  Endo/Heme/Allergies:  Does not bruise/bleed easily.  Psychiatric/Behavioral:  Negative for depression. The patient is not nervous/anxious and does not have insomnia.       PAST MEDICAL HISTORY :  Past Medical History:  Diagnosis Date   Asthma    Cancer (HCC)    Family history of adverse reaction to anesthesia    ponv    PAST SURGICAL HISTORY :   Past Surgical History:  Procedure Laterality Date   heel fracture Bilateral    RADIOACTIVE SEED IMPLANT N/A 03/09/2018   Procedure: RADIOACTIVE SEED IMPLANT/BRACHYTHERAPY IMPLANT;  Surgeon: Twylla Glendia BROCKS, MD;  Location: [facility] ORS;  Service: Urology;  Laterality: N/A;   TONSILECTOMY, ADENOIDECTOMY, BILATERAL MYRINGOTOMY AND TUBES      FAMILY HISTORY :   Family History  Problem Relation Age of Onset   Healthy Mother    Lung cancer Father    Skin cancer Brother    Cancer Paternal Uncle    Prostate cancer Neg Hx    Bladder Cancer Neg Hx    Kidney cancer Neg Hx     SOCIAL HISTORY:   Social History   Tobacco Use   Smoking status: Former    Types: Cigarettes   Smokeless tobacco: Never  Vaping Use   Vaping status: Never Used  Substance Use Topics   Alcohol use: Yes    Alcohol/week: 8.0 standard drinks of alcohol    Types: 6 Cans of beer, 2 Shots of liquor per week   Drug use: Not Currently    ALLERGIES:  is allergic to simvastatin and donnatal [pb-hyoscy-atropine-scopolamine].  MEDICATIONS:  Current Outpatient Medications  Medication Sig Dispense Refill    apalutamide  (ERLEADA ) 60 MG tablet Take 180 mg by mouth daily.     calcium-vitamin D  (OSCAL WITH D) 500-200 MG-UNIT tablet Take 1 tablet by mouth.     diphenhydrAMINE (BENADRYL) 25 MG tablet Take 25 mg by mouth daily as needed for allergies.     doxylamine, Sleep, (UNISOM) 25 MG tablet Take 25 mg by mouth at bedtime as needed for sleep.     Naproxen Sod-diphenhydrAMINE 220-25 MG TABS Take 1 tablet by mouth at bedtime as needed (sleep).      rosuvastatin (CRESTOR) 10 MG tablet Take 10 mg by mouth daily.     tamsulosin  (FLOMAX ) 0.4 MG CAPS capsule Take 2 capsules (0.8 mg total) by mouth daily. 60 capsule 3   tamsulosin  (FLOMAX ) 0.4 MG CAPS capsule Take 2 capsules (0.8 mg total) by mouth daily. 180 capsule 3   No current facility-administered medications for this visit.    PHYSICAL EXAMINATION:   BP 125/83   Pulse (!) 59   Temp 97.6 F (36.4 C)   Resp 18   Ht 6' (1.829 m)   Wt 217 lb 6.4 oz (98.6 kg)   SpO2 98%   BMI 29.48 kg/m   [employer]  06/06/24 1038  Weight: 217 lb 6.4 oz (98.6 kg)    Physical Exam Vitals and nursing note reviewed.  HENT:     Head: Normocephalic and atraumatic.     Mouth/Throat:     Pharynx: Oropharynx is clear.  Eyes:     Extraocular Movements: Extraocular movements intact.     Pupils: Pupils are equal, round, and reactive to light.  Cardiovascular:     Rate and Rhythm: Normal rate and regular rhythm.  Pulmonary:     Comments: Decreased breath sounds bilaterally.  Abdominal:     Palpations: Abdomen is soft.  Musculoskeletal:        General: Normal range of motion.     Cervical back: Normal range of motion.  Skin:    General: Skin is warm.  Neurological:     General: No focal deficit present.     Mental Status: He is alert and oriented to person, place, and time.  Psychiatric:        Behavior: Behavior normal.        Judgment: Judgment normal.      LABORATORY DATA:  I have reviewed the data as listed    Component Value Date/Time    NA 138 06/02/2024 0930   K 4.4 06/02/2024 0930   CL 103 06/02/2024 0930   CO2 25 06/02/2024 0930   GLUCOSE 122 (H) 06/02/2024 0930   BUN 15 06/02/2024 0930   CREATININE 0.90 06/02/2024 0930   CALCIUM 9.4 06/02/2024 0930   PROT 6.4 (L) 06/02/2024 0930   ALBUMIN 4.1 06/02/2024 0930   AST 15 06/02/2024 0930   ALT 14 06/02/2024 0930   ALKPHOS 77 06/02/2024 0930   BILITOT 0.2 06/02/2024 0930   GFRNONAA >60 06/02/2024 0930    No results found for: SPEP, UPEP  Lab Results  Component Value Date   WBC 5.2 06/02/2024   NEUTROABS 3.3 06/02/2024   HGB 13.3 06/02/2024   HCT 39.2 06/02/2024   MCV 91.0 06/02/2024   PLT 252 06/02/2024      Chemistry      Component Value Date/Time   NA 138 06/02/2024 0930   K 4.4 06/02/2024 0930   CL 103 06/02/2024 0930   CO2 25 06/02/2024 0930   BUN 15 06/02/2024 0930   CREATININE 0.90 06/02/2024 0930      Component Value Date/Time   CALCIUM 9.4 06/02/2024 0930   ALKPHOS 77 06/02/2024 0930   AST 15 06/02/2024 0930   ALT 14 06/02/2024 0930   BILITOT 0.2 06/02/2024 0930       RADIOGRAPHIC STUDIES: I have personally reviewed the radiological images as listed and agreed with the findings in the report. No results found.   ASSESSMENT & PLAN:  Prostate cancer (HCC) # Stage IVa  [SEP 2025- PSMA PET scan: metastatic spread to regional/nonregional lymph nodes; no bone metastases] LOW VOLUME DISEASE CASTRATE SENSITIVE PROSTATE CANCER: on ADT + Erleda. [Sep mid 2025]. 2019- IMRT + brachytherapy + ADT (24 months)[ Brachytherapy 02/2018  # JAN 2026 PSA improving- 0.02/improving; recommend decrease the dose of Erleda to 3 pills a day sec to brain fog- [see below].  Discussed with pharmacy will renew prescription.  # Prostatism symptoms [Dr. Stoioff]-currently on Flomax  2 pills; defer to urolog re: straight cath/difficulty sleeping at night- stable.   # Depression/ Fatigue: Patient has no other identifiable causes of fatigue at this time.  I  had a long discussion with the patient regarding the ability of structured exercise program help treat cancer related fatigue.  Introduced the care program available to cancer patients at [facility]. Patient again declines at this time- he will try [employer].   Eligard - 9/18  # DISPOSITION: # Follow up in 2 months- MD; PRIOR-  2-3 days labs- cbc/cmp; PSA; Eligard - Dr.B    Orders Placed This Encounter  Procedures   CBC with Differential (Cancer Center Only)    Standing Status:   Future    Expected Date:   08/01/2024    Expiration Date:   10/30/2024   CMP (Cancer Center only)    Standing Status:   Future    Expected Date:   08/01/2024    Expiration Date:   10/30/2024   PSA    Standing Status:   Future    Expected Date:   08/01/2024    Expiration Date:   10/30/2024   All questions were answered. The patient knows to call the clinic with any problems, questions or concerns.      Cindy JONELLE Dawan, MD 06/06/2024 11:18 AM

## 2024-06-06 NOTE — Telephone Encounter (Signed)
 Oral Oncology Patient Advocate Encounter   Submitted application for assistance for erleada  to [employer] Patient Assistance Program.   Application submitted via e-fax to [phone]   [employer] Patient Assistance Program phone number [phone].   I will continue to check the status until final determination.   Christyann Manolis ([name]) [name], CPhT  [facility], [name], [name] - Oral Chemotherapy Patient Advocate Specialist III Phone: [phone]  Fax: [phone]

## 2024-06-06 NOTE — Assessment & Plan Note (Addendum)
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 [Sep mid 2025]. 2019- IMRT + brachytherapy + ADT (24 months)[ Brachytherapy 02/2018  # JAN 2026 PSA improving- 0.02/improving; recommend decrease the dose of Erleda to 3 pills a day sec to brain fog- [see below].  Discussed with pharmacy will renew prescription.  # Prostatism symptoms [Dr. Stoioff]-currently on Flomax  2 pills; defer to urolog re: straight cath/difficulty sleeping at night- stable.   # Depression/ Fatigue: Patient has no other identifiable causes of fatigue at this time.  I had a long discussion with the patient regarding the ability of structured exercise program help treat cancer related fatigue.  Introduced the care program available to cancer patients at [facility]. Patient again declines at this time- he will try [employer].   Eligard - 9/18  # DISPOSITION: # Follow up in 2 months- MD; PRIOR-  2-3 days labs- cbc/cmp; PSA; Eligard - Dr.B

## 2024-06-06 NOTE — Progress Notes (Signed)
 Patient has no concerns.  Patient did mention he still having the dizziness. It's a mild dizzy does not loose balance.

## 2024-06-09 NOTE — Telephone Encounter (Signed)
 Oral Oncology Patient Advocate Encounter  Called to check on the status of the patient's application. Representative stated that the application is still being processed.  [employer] Patient Assistance Program phone number [phone].  I will continue to check the status until final determination.  Keshara Kiger ([name]) [name], CPhT  [facility], [name], [name] - Oral Chemotherapy Patient Advocate Specialist III Phone: [phone]  Fax: [phone]

## 2024-06-14 NOTE — Telephone Encounter (Signed)
 Oral Oncology Patient Advocate Encounter  Called to check on status, per representative, they have not received the application.   I have e-faxed it a total of 3 times to [phone]  I have also e-faxed x1 and manually faxed x1 it to [phone]  [employer] Patient Assistance Program phone number [phone].   I will continue to check the status until final determination.  Tasheba Henson ([name]) [name], CPhT  [facility], [name], [name] - Oral Chemotherapy Patient Advocate Specialist III Phone: [phone]  Fax: [phone]

## 2024-06-16 ENCOUNTER — Other Ambulatory Visit: Payer: Self-pay | Admitting: Internal Medicine

## 2024-06-16 ENCOUNTER — Other Ambulatory Visit: Payer: Self-pay

## 2024-06-16 DIAGNOSIS — C61 Malignant neoplasm of prostate: Secondary | ICD-10-CM

## 2024-06-16 NOTE — Telephone Encounter (Signed)
 Oral Oncology Patient Advocate Encounter  Called to check on status, per representative, they have received the patient's complete application.  It is under review.  [employer] Patient Assistance Program phone number [phone].   I will continue to check the status until final determination.  Traylon Schimming ([name]) [name], CPhT  [facility], [name], [name] - Oral Chemotherapy Patient Advocate Specialist III Phone: [phone]  Fax: [phone]

## 2024-06-16 NOTE — Progress Notes (Signed)
 Specialty Pharmacy Refill Coordination Note  Roy Gentry is a 74 y.o. male contacted today regarding refills of specialty medication(s) Apalutamide  (ERLEADA )   Patient requested Delivery   Delivery date: 06/24/24   Verified address: [address] [name] [address]   Medication will be filled on: 06/23/24  *Dose was lowered to 3 per day, faxed MD for updated Rx.   This fill date is pending response to refill request from provider. Patient is aware and if they have not received fill by intended date they must follow up with pharmacy.

## 2024-06-16 NOTE — Progress Notes (Signed)
 Specialty Pharmacy Ongoing Clinical Assessment Note  ULIS KAPS is a 74 y.o. male who is being followed by the specialty pharmacy service for RxSp Oncology   Patient's specialty medication(s) reviewed today: Apalutamide  (ERLEADA )   Missed doses in the last 4 weeks: 0   Patient/Caregiver did not have any additional questions or concerns.   Therapeutic benefit summary: Patient is achieving benefit   Adverse events/side effects summary: Experienced adverse events/side effects (Patient was experiencing brain fog and fatigue on the 4 per day, but is doing better since his dose was lowered to 3 per day.)   Patient's therapy is appropriate to: Continue    Goals Addressed             This Visit's Progress    Slow Disease Progression   On track    Patient is on track. Patient will maintain adherence.  Most recent PSA was down to 0.02 ng/mL as of 06/02/24.         Follow up: 3 months  Silvano LOISE Dolly Specialty Pharmacist

## 2024-06-17 ENCOUNTER — Encounter: Payer: Self-pay | Admitting: Internal Medicine

## 2024-06-17 ENCOUNTER — Other Ambulatory Visit: Payer: Self-pay

## 2024-06-17 MED ORDER — APALUTAMIDE 60 MG PO TABS
180.0000 mg | ORAL_TABLET | Freq: Every day | ORAL | 2 refills | Status: AC
  Filled 2024-06-17 – 2024-06-28 (×2): qty 120, 40d supply, fill #0

## 2024-06-20 ENCOUNTER — Other Ambulatory Visit: Payer: Self-pay | Admitting: Pharmacy Technician

## 2024-06-20 NOTE — Progress Notes (Signed)
 Oral Oncology Patient Advocate Encounter  Waiting to see if pt gets approved for PAP. If not then he agreed to pay OOP.  Marliss Buttacavoli ([name]) [name], CPhT  [facility], [name], [name] - Oral Chemotherapy Patient Advocate Specialist III Phone: [phone]  Fax: [phone]

## 2024-06-22 NOTE — Telephone Encounter (Signed)
 Oral Oncology Patient Advocate Encounter  Application is still under review and has been escalated due to how long the process has taken.  [employer] Patient Assistance Program phone number [phone].   I will continue to check the status until final determination.   Roy Gentry ([name]) [name], CPhT  [name], [name], [name] - Oral Chemotherapy Patient Advocate Specialist III Phone: [phone]  Fax: [phone]

## 2024-06-24 NOTE — Telephone Encounter (Signed)
 Oral Oncology Patient Advocate Encounter  Application is still under review.  Clerance Essex, Field Reimbursement Manager for [employer], reached out to me to let me know that she has also escalated this patient's application to more people.  I will continue to check the status until final determination.   [employer] Patient Assistance Program phone number [phone].   Roy Gentry ([employer] Case Manager) Ext: [phone]  Clerance Essex direct phone number: [phone]  Barbette ([name]) [name], CPhT  [facility], [name], [name] - Oral Chemotherapy Pharmacy Technician III Certified Patient Advocate Phone: [phone]  Fax: [phone]

## 2024-06-27 ENCOUNTER — Other Ambulatory Visit (HOSPITAL_COMMUNITY): Payer: Self-pay

## 2024-06-28 ENCOUNTER — Other Ambulatory Visit: Payer: Self-pay

## 2024-06-28 NOTE — Telephone Encounter (Signed)
 Oral Oncology Patient Advocate Encounter  Patient's application is under final review and may need verbal consent from the patient (pending review).  I will continue to check the status until final determination.    [employer] Patient Assistance Program phone number [phone].    Levon Hope ([employer] Case Manager) Ext: [phone]   Disha Lang hospital doctor for [employer]) direct phone number: [phone]  Barbette ([name]) [name], CPhT  [facility], [name], [name] - Oral Chemotherapy Pharmacy Technician III Certified Patient Advocate Phone: [phone]  Fax: [phone]

## 2024-06-29 NOTE — Telephone Encounter (Signed)
 Oral Oncology Patient Advocate Encounter   Received notification that the application for assistance for Erleada  through [employer] Patient Assistance Program has been approved.   [employer] Patient Assistance Program phone number [phone].   Effective dates: 06/28/2024 through 05/25/2025  Medication will be filled at [facility].  I have spoken to the patient.  Zanasia Hickson ([name]) [name], CPhT  [facility], [name], [name] - Oral Chemotherapy Pharmacy Technician III Certified Patient Advocate Phone: [phone]  Fax: [phone]

## 2024-08-02 ENCOUNTER — Inpatient Hospital Stay

## 2024-08-05 ENCOUNTER — Inpatient Hospital Stay

## 2024-08-05 ENCOUNTER — Inpatient Hospital Stay: Admitting: Internal Medicine
